# Patient Record
Sex: Female | Born: 1950 | Race: White | Hispanic: No | Marital: Married | Smoking: Never smoker
Health system: Southern US, Community
[De-identification: ages and names within clinical notes are randomized; demographics above are authoritative.]

## PROBLEM LIST (undated history)

## (undated) DIAGNOSIS — F32A Depression, unspecified: Secondary | ICD-10-CM

## (undated) DIAGNOSIS — K219 Gastro-esophageal reflux disease without esophagitis: Secondary | ICD-10-CM

## (undated) DIAGNOSIS — H409 Unspecified glaucoma: Secondary | ICD-10-CM

## (undated) DIAGNOSIS — H8109 Meniere's disease, unspecified ear: Secondary | ICD-10-CM

## (undated) DIAGNOSIS — Z87898 Personal history of other specified conditions: Secondary | ICD-10-CM

## (undated) DIAGNOSIS — I10 Essential (primary) hypertension: Secondary | ICD-10-CM

## (undated) DIAGNOSIS — M1990 Unspecified osteoarthritis, unspecified site: Secondary | ICD-10-CM

## (undated) DIAGNOSIS — F329 Major depressive disorder, single episode, unspecified: Secondary | ICD-10-CM

## (undated) DIAGNOSIS — E785 Hyperlipidemia, unspecified: Secondary | ICD-10-CM

## (undated) DIAGNOSIS — G4730 Sleep apnea, unspecified: Secondary | ICD-10-CM

## (undated) HISTORY — PX: CHOLECYSTECTOMY: SHX55

## (undated) HISTORY — DX: Major depressive disorder, single episode, unspecified: F32.9

## (undated) HISTORY — DX: Personal history of other specified conditions: Z87.898

## (undated) HISTORY — DX: Unspecified osteoarthritis, unspecified site: M19.90

## (undated) HISTORY — PX: APPENDECTOMY: SHX54

## (undated) HISTORY — DX: Depression, unspecified: F32.A

---

## 1993-05-03 HISTORY — PX: CRYOTHERAPY: SHX1416

## 1995-02-17 HISTORY — PX: ABDOMINAL HYSTERECTOMY: SHX81

## 2005-05-19 ENCOUNTER — Ambulatory Visit: Payer: Self-pay | Admitting: Otolaryngology

## 2006-02-15 ENCOUNTER — Emergency Department: Payer: Self-pay | Admitting: General Practice

## 2006-08-03 HISTORY — PX: CARDIAC CATHETERIZATION: SHX172

## 2006-10-06 ENCOUNTER — Ambulatory Visit: Payer: Self-pay | Admitting: Gastroenterology

## 2006-10-15 ENCOUNTER — Ambulatory Visit (HOSPITAL_COMMUNITY): Admission: RE | Admit: 2006-10-15 | Discharge: 2006-10-15 | Payer: Self-pay | Admitting: Cardiovascular Disease

## 2006-10-15 ENCOUNTER — Emergency Department: Payer: Self-pay | Admitting: Emergency Medicine

## 2007-11-16 ENCOUNTER — Emergency Department: Payer: Self-pay | Admitting: Emergency Medicine

## 2007-11-16 ENCOUNTER — Other Ambulatory Visit: Payer: Self-pay

## 2007-11-19 ENCOUNTER — Ambulatory Visit: Payer: Self-pay | Admitting: Otolaryngology

## 2009-01-31 ENCOUNTER — Ambulatory Visit: Payer: Self-pay | Admitting: Internal Medicine

## 2009-02-01 ENCOUNTER — Emergency Department: Payer: Self-pay | Admitting: Emergency Medicine

## 2009-02-15 ENCOUNTER — Ambulatory Visit: Payer: Self-pay | Admitting: Internal Medicine

## 2009-02-21 ENCOUNTER — Ambulatory Visit: Payer: Self-pay | Admitting: Gastroenterology

## 2009-03-01 ENCOUNTER — Ambulatory Visit: Payer: Self-pay | Admitting: Gastroenterology

## 2009-03-22 ENCOUNTER — Ambulatory Visit: Payer: Self-pay | Admitting: Surgery

## 2009-07-30 ENCOUNTER — Ambulatory Visit: Payer: Self-pay

## 2009-07-31 ENCOUNTER — Ambulatory Visit: Payer: Self-pay

## 2009-08-03 DIAGNOSIS — Z87898 Personal history of other specified conditions: Secondary | ICD-10-CM

## 2009-08-03 HISTORY — DX: Personal history of other specified conditions: Z87.898

## 2009-08-07 ENCOUNTER — Ambulatory Visit: Payer: Self-pay

## 2010-01-14 ENCOUNTER — Ambulatory Visit: Payer: Self-pay | Admitting: Family Medicine

## 2010-08-13 ENCOUNTER — Ambulatory Visit: Payer: Self-pay

## 2011-02-26 ENCOUNTER — Telehealth: Payer: Self-pay | Admitting: Family Medicine

## 2011-02-26 NOTE — Telephone Encounter (Signed)
error 

## 2011-11-09 ENCOUNTER — Ambulatory Visit: Payer: Self-pay

## 2012-10-05 ENCOUNTER — Emergency Department: Payer: Self-pay | Admitting: Emergency Medicine

## 2012-10-05 LAB — URINALYSIS, COMPLETE
Bacteria: NONE SEEN
Bilirubin,UR: NEGATIVE
Glucose,UR: NEGATIVE mg/dL (ref 0–75)
Ketone: NEGATIVE
Nitrite: NEGATIVE
Ph: 7 (ref 4.5–8.0)
Protein: NEGATIVE
Specific Gravity: 1.009 (ref 1.003–1.030)

## 2012-10-05 LAB — COMPREHENSIVE METABOLIC PANEL
Albumin: 3.2 g/dL — ABNORMAL LOW (ref 3.4–5.0)
Anion Gap: 6 — ABNORMAL LOW (ref 7–16)
Bilirubin,Total: 0.6 mg/dL (ref 0.2–1.0)
Chloride: 107 mmol/L (ref 98–107)
Co2: 22 mmol/L (ref 21–32)
EGFR (African American): 46 — ABNORMAL LOW
Glucose: 105 mg/dL — ABNORMAL HIGH (ref 65–99)
Potassium: 5.2 mmol/L — ABNORMAL HIGH (ref 3.5–5.1)
SGOT(AST): 43 U/L — ABNORMAL HIGH (ref 15–37)
Sodium: 135 mmol/L — ABNORMAL LOW (ref 136–145)
Total Protein: 7.4 g/dL (ref 6.4–8.2)

## 2012-10-05 LAB — CLOSTRIDIUM DIFFICILE BY PCR

## 2012-10-05 LAB — CBC
HGB: 15.5 g/dL (ref 12.0–16.0)
MCH: 32.2 pg (ref 26.0–34.0)
Platelet: 276 10*3/uL (ref 150–440)
RBC: 4.8 10*6/uL (ref 3.80–5.20)
RDW: 12.9 % (ref 11.5–14.5)
WBC: 9.6 10*3/uL (ref 3.6–11.0)

## 2012-10-05 LAB — LIPASE, BLOOD: Lipase: 146 U/L (ref 73–393)

## 2012-10-05 LAB — OCCULT BLOOD X 1 CARD TO LAB, STOOL: Occult Blood, Feces: NEGATIVE

## 2012-10-06 LAB — COMPREHENSIVE METABOLIC PANEL
Albumin: 3.1 g/dL — ABNORMAL LOW (ref 3.4–5.0)
Alkaline Phosphatase: 100 U/L (ref 50–136)
Anion Gap: 6 — ABNORMAL LOW (ref 7–16)
BUN: 21 mg/dL — ABNORMAL HIGH (ref 7–18)
Bilirubin,Total: 0.8 mg/dL (ref 0.2–1.0)
Calcium, Total: 8.1 mg/dL — ABNORMAL LOW (ref 8.5–10.1)
Co2: 22 mmol/L (ref 21–32)
Creatinine: 1.81 mg/dL — ABNORMAL HIGH (ref 0.60–1.30)
EGFR (Non-African Amer.): 29 — ABNORMAL LOW
Osmolality: 271 (ref 275–301)
SGOT(AST): 65 U/L — ABNORMAL HIGH (ref 15–37)
Sodium: 134 mmol/L — ABNORMAL LOW (ref 136–145)
Total Protein: 7.3 g/dL (ref 6.4–8.2)

## 2012-10-06 LAB — CBC WITH DIFFERENTIAL/PLATELET
Basophil #: 0.2 10*3/uL — ABNORMAL HIGH (ref 0.0–0.1)
Eosinophil #: 0.1 10*3/uL (ref 0.0–0.7)
Lymphocyte %: 14.2 %
MCH: 33.5 pg (ref 26.0–34.0)
MCHC: 34.9 g/dL (ref 32.0–36.0)
MCV: 96 fL (ref 80–100)
Monocyte #: 0.8 x10 3/mm (ref 0.2–0.9)
Monocyte %: 10.2 %
Neutrophil #: 5.9 10*3/uL (ref 1.4–6.5)
Neutrophil %: 72.2 %
Platelet: 282 10*3/uL (ref 150–440)
RBC: 4.58 10*6/uL (ref 3.80–5.20)

## 2012-10-06 LAB — TROPONIN I: Troponin-I: <0.02 ng/mL

## 2012-10-06 LAB — LIPASE, BLOOD: Lipase: 133 U/L (ref 73–393)

## 2012-10-07 ENCOUNTER — Inpatient Hospital Stay: Payer: Self-pay | Admitting: Internal Medicine

## 2012-10-07 LAB — CLOSTRIDIUM DIFFICILE BY PCR

## 2012-10-07 LAB — COMPREHENSIVE METABOLIC PANEL
Albumin: 2.4 g/dL — ABNORMAL LOW (ref 3.4–5.0)
BUN: 17 mg/dL (ref 7–18)
Bilirubin,Total: 1.4 mg/dL — ABNORMAL HIGH (ref 0.2–1.0)
Creatinine: 1.35 mg/dL — ABNORMAL HIGH (ref 0.60–1.30)
EGFR (Non-African Amer.): 42 — ABNORMAL LOW
Glucose: 97 mg/dL (ref 65–99)
SGOT(AST): 223 U/L — ABNORMAL HIGH (ref 15–37)
Sodium: 138 mmol/L (ref 136–145)

## 2012-10-07 LAB — CBC WITH DIFFERENTIAL/PLATELET
Basophil #: 0 10*3/uL (ref 0.0–0.1)
Eosinophil %: 0.6 %
HCT: 36.4 % (ref 35.0–47.0)
Lymphocyte %: 16.7 %
MCH: 33 pg (ref 26.0–34.0)
MCV: 96 fL (ref 80–100)
Neutrophil %: 71.2 %
Platelet: 191 10*3/uL (ref 150–440)
RBC: 3.8 10*6/uL (ref 3.80–5.20)
RDW: 12.5 % (ref 11.5–14.5)
WBC: 5.1 10*3/uL (ref 3.6–11.0)

## 2012-10-07 LAB — OCCULT BLOOD X 1 CARD TO LAB, STOOL: Occult Blood, Feces: POSITIVE

## 2012-10-07 LAB — URINALYSIS, COMPLETE
Nitrite: NEGATIVE
Protein: NEGATIVE
Specific Gravity: 1.008 (ref 1.003–1.030)

## 2012-10-07 LAB — STOOL CULTURE

## 2012-10-08 LAB — WBCS, STOOL

## 2012-10-08 LAB — COMPREHENSIVE METABOLIC PANEL
Alkaline Phosphatase: 141 U/L — ABNORMAL HIGH (ref 50–136)
Anion Gap: 9 (ref 7–16)
BUN: 9 mg/dL (ref 7–18)
Chloride: 115 mmol/L — ABNORMAL HIGH (ref 98–107)
EGFR (Non-African Amer.): 47 — ABNORMAL LOW
Glucose: 96 mg/dL (ref 65–99)
SGOT(AST): 93 U/L — ABNORMAL HIGH (ref 15–37)
Sodium: 141 mmol/L (ref 136–145)

## 2012-10-08 LAB — CBC WITH DIFFERENTIAL/PLATELET
Basophil #: 0 10*3/uL (ref 0.0–0.1)
Basophil %: 0.4 %
Eosinophil #: 0.1 10*3/uL (ref 0.0–0.7)
Eosinophil %: 1.4 %
MCHC: 34.3 g/dL (ref 32.0–36.0)
Monocyte #: 0.4 x10 3/mm (ref 0.2–0.9)
Monocyte %: 8.1 %
Platelet: 204 10*3/uL (ref 150–440)
RBC: 3.79 10*6/uL — ABNORMAL LOW (ref 3.80–5.20)
WBC: 4.7 10*3/uL (ref 3.6–11.0)

## 2012-10-09 LAB — CBC WITH DIFFERENTIAL/PLATELET
Basophil #: 0.1 10*3/uL (ref 0.0–0.1)
Eosinophil #: 0.1 10*3/uL (ref 0.0–0.7)
Eosinophil %: 1.2 %
HCT: 34.2 % — ABNORMAL LOW (ref 35.0–47.0)
HGB: 11.9 g/dL — ABNORMAL LOW (ref 12.0–16.0)
Lymphocyte %: 24.1 %
MCH: 33.6 pg (ref 26.0–34.0)
MCHC: 34.8 g/dL (ref 32.0–36.0)
MCV: 97 fL (ref 80–100)
Monocyte #: 0.4 x10 3/mm (ref 0.2–0.9)
Monocyte %: 7.1 %
Neutrophil #: 3.7 10*3/uL (ref 1.4–6.5)
RBC: 3.54 10*6/uL — ABNORMAL LOW (ref 3.80–5.20)
WBC: 5.5 10*3/uL (ref 3.6–11.0)

## 2012-10-09 LAB — COMPREHENSIVE METABOLIC PANEL
Albumin: 2.4 g/dL — ABNORMAL LOW (ref 3.4–5.0)
Alkaline Phosphatase: 124 U/L (ref 50–136)
Anion Gap: 6 — ABNORMAL LOW (ref 7–16)
Bilirubin,Total: 0.5 mg/dL (ref 0.2–1.0)
Calcium, Total: 7.7 mg/dL — ABNORMAL LOW (ref 8.5–10.1)
Chloride: 113 mmol/L — ABNORMAL HIGH (ref 98–107)
Co2: 23 mmol/L (ref 21–32)
Creatinine: 1.2 mg/dL (ref 0.60–1.30)
EGFR (African American): 56 — ABNORMAL LOW
Osmolality: 282 (ref 275–301)
Sodium: 142 mmol/L (ref 136–145)

## 2012-10-09 LAB — STOOL CULTURE

## 2013-11-17 DIAGNOSIS — E8940 Asymptomatic postprocedural ovarian failure: Secondary | ICD-10-CM | POA: Insufficient documentation

## 2013-12-06 ENCOUNTER — Ambulatory Visit: Payer: Self-pay | Admitting: Internal Medicine

## 2014-01-12 ENCOUNTER — Ambulatory Visit: Payer: Self-pay | Admitting: Internal Medicine

## 2014-01-25 DIAGNOSIS — M5416 Radiculopathy, lumbar region: Secondary | ICD-10-CM | POA: Insufficient documentation

## 2014-03-22 DIAGNOSIS — M1612 Unilateral primary osteoarthritis, left hip: Secondary | ICD-10-CM | POA: Insufficient documentation

## 2014-03-26 ENCOUNTER — Ambulatory Visit: Payer: Self-pay

## 2014-11-23 NOTE — Discharge Summary (Signed)
PATIENT NAME:  Tiffany, Cisneros MR#:  [id number] DATE OF BIRTH:  18-Jul-1951  DATE OF ADMISSION:  10/07/2012 DATE OF DISCHARGE:  10/09/2012  DISCHARGE DIAGNOSES:  1.  Viral hepatitis/gastroenteritis.  2.  Reflux esophagitis.  3.  Urinary incontinence.   DISCHARGE MEDICATIONS: Sertraline 50 mg daily, Prilosec 20 mg daily, estradiol weekly, Toviaz 8 mg q.p.m., , hyoscyamine 0.5 mg b.i.d. x 3 days.   REASON FOR ADMISSION: This 64 year old female presents with intractable diarrhea and diffuse abdominal pain with abnormal LFTs. Please see history and physical for HPI, past medical history and physical exam.  HOSPITAL COURSE: The patient was admitted. C. diff and stool cultures were negative. Hepatitis A IgM negative. IgG positive. The patient was hydrated and placed on IV Protonix. It was thought that this was likely viral gastroenteritis with viral hepatitis, likely adenovirus. She was given Levsin and Imodium. Her symptoms improved. She is back to baseline. Her LFTs normalized prior to discharge. She was still be on an anti-spasm medicines for 3 days with Probiotic, back to work tomorrow.    ____________________________ Rusty Aus, MD mfm:aw D: 10/09/2012 09:52:53 ET T: 10/09/2012 11:57:58 ET JOB#: [id number]  cc: Rusty Aus, MD, <Dictator> Rusty Aus MD ELECTRONICALLY SIGNED 10/12/2012 7:58

## 2014-11-23 NOTE — H&P (Signed)
PATIENT NAME:  Tiffany Cisneros, Tiffany Cisneros MR#:  [id number] DATE OF BIRTH:  03-03-1951  DATE OF ADMISSION:  10/07/2012  PRIMARY CARE PHYSICIAN:  Dr. Emily Filbert.   REFERRING PHYSICIAN:  Dr. Owens Shark.   CHIEF COMPLAINT:  Diarrhea.   HISTORY OF PRESENT ILLNESS:  The patient is a 64 year old Caucasian female with a past medical history of hypertension and hyperlipidemia, is presenting to the ER with a chief complaint of a 5 day history of watery diarrhea.  The patient has been experiencing watery diarrhea with no blood, nausea and lower abdominal pain for the past 5 days.  She was evaluated in the ER on March 5th for the same etiology and she was given IV fluids and sent home with nausea medication.  Her clinical situation did not get better and it has been getting worse.  The patient was brought into the ER again.  During the previous ER visit she had CAT scan of the abdomen and pelvis done which has revealed no acute pathology.  This time patient had an abdominal series done which has revealed distended bowel, but no air-fluid levels.  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  The patient denies any fever, but complaining of weakness and dizziness.  Family members are at bedside.  Abdominal pain is in the lower part of the abdomen, squeezing type, 8 to 9 out of 10.   PAST MEDICAL HISTORY:  Hypertension and hyperlipidemia.   PAST SURGICAL HISTORY:  Appendectomy, hysterectomy, cholecystectomy.   ALLERGIES:  She is allergic to [place].   HOME MEDICATIONS:  Zofran ODT 1 tablet by mouth q. 8 hours, Toviaz 1 tablet by mouth once daily, sertraline 1 tablet once daily, Prevacid 20 mg once daily, Augmentin 1 tablet by mouth q. 12 hours, Align capsule 4 mg once daily as needed.   PSYCHOSOCIAL HISTORY:  Lives at home with husband.  Denies smoking, alcohol or illicit drug usage.   FAMILY HISTORY:  Her father had a  history of TIA.   REVIEW OF SYSTEMS:  CONSTITUTIONAL:  Denies any fever, but complaining of fatigue and weakness.  Complaining of her abdominal pain.  Denies any weight gain.  EYES:  No blurry vision or glaucoma.  EARS, NOSE, THROAT:  Denies any ear pain or snoring, postnasal drip.  RESPIRATIONS:  No cough, COPD.  CARDIOVASCULAR:  No chest pain, palpitations, syncope.  GASTROINTESTINAL:  Complaining of nausea.  Denies any vomiting.  Complaining of diarrhea for the past five days.  No hematemesis or melena.  Lower abdominal pain is present.  GENITOURINARY:  No dysuria, hematuria. GYNECOLOGIC AND BREASTS:  No breast mass or vaginal discharge.  ENDOCRINE:  No polyuria, polyphagia, polydipsia.  Denies any thyroid problems.  HEMATOLOGIC AND LYMPHATIC:  Denies any anemia or easy bruising.  INTEGUMENTARY:  No acne, rash, lesions.  MUSCULOSKELETAL:  No joint pain in the neck, back, shoulder.  Denies arthritis or gout.  NEUROLOGIC:  No vertigo.  No ataxia.  Denies dementia, CVA, TIA.  PSYCHIATRIC:  No history of ADD or OCD.   PHYSICAL EXAMINATION: VITAL SIGNS:  Temperature 98.2, pulse 61, respirations 16, blood pressure 123/53, pulse ox 96%.  GENERAL APPEARANCE:  Uncomfortable from pain, but not under acute distress, moderately built and moderately nourished.  HEENT:  Normocephalic, atraumatic.  Pupils are equally reacting to light and accommodation.  No scleral icterus.  No conjunctival injection.  NECK:  Supple.  No JVD.  No thyromegaly.  No lymphadenopathy.  LUNGS:  Clear to auscultation bilaterally.  No accessory muscle usage.  No anterior chest wall tenderness on palpation.  CARDIAC:  S1, S2 normal.  Regular rate and rhythm.  No murmurs.  No peripheral edema.  GASTROINTESTINAL:  Soft.  Hyperactive bowel sounds in all four quadrants.  Diffuse lower abdominal tenderness is present.  No epigastric tenderness.  No right upper quadrant or left upper quadrant tenderness is present.  No rebound  tenderness.  No CV angle tenderness. NEUROLOGIC:  Awake, alert and oriented x 3.  Answering questions appropriately.  Cranial nerves II through XII are grossly intact.  Motor and sensory is grossly intact.  EXTREMITIES:  No edema.  No cyanosis.  No clubbing.  SKIN:  With no rashes, lesions or acne.  Dry to touch.  Normal skin turgor.  MUSCULOSKELETAL:  No joint effusion, tenderness or erythema.  PSYCHIATRIC:  Normal mood and affect.   LABORATORY AND IMAGING STUDIES:  Abdominal series has revealed distended bowels, but no air fluid levels.  Chest x-ray, no acute findings.  A 12-lead EKG, normal sinus rhythm.  No acute ST-T wave changes.  The patient's glucose is 86, BUN 21, creatinine 1.81, sodium 134, potassium 3.8, chloride 106, CO2 22, GFR is 29, anion gap 6, serum osmolality 271, calcium 8.1, lipase is 133, troponin T less than 0.02.  WBC 8.2, hemoglobin 15.3, hematocrit is 43.9, platelet count 282.  Stool for Clostridium difficile toxin, which was collected on March 5th, is negative.  Stool culture collected from March 5th has revealed no Salmonella, Shigella, Escherichia coli or Campylobacter.  Clostridium difficile toxin is also negative.  LFTs, albumin is low at 3.1, alkaline phosphatase is at 100, ALT 204, AST is 65.   ASSESSMENT AND PLAN:  A 64 year old female presenting with lower abdominal pain and diarrhea associated with nausea, will be admitted with the following assessment and plan.  1.  Acute gastroenteritis, probably viral.  We will keep her nothing by mouth.  We will provide her IV fluids.  Pain management with morphine IV.  We will provide her Protonix IV for GI prophylaxis.  We will repeat stool tests including Clostridium difficile toxin.  We will place her on enteric precautions.  If the situation is getting worse we will consider adding antibiotics.  Recent stool test which was done on March 5th has revealed negative Clostridium difficile, Shigella, Salmonella, Escherichia coli and  Campylobacter.  2.  Acute kidney injury.  We will give her IV fluids.  3.  Hypertension.  Blood pressure is on the lower side.  We will provide her IV fluids and hold home medications for blood pressure.  4.  Hyperlipidemia.  Hold her home medications.  5.  We will provide her deep vein thrombosis prophylaxis.   CODE STATUS:  SHE IS FULL CODE.   TOTAL TIME SPENT ON ADMISSION:  Is 50 minutes.   The diagnosis and plan of care was discussed in detail with the patient and her family members at bedside.  They all verbalized understanding of the plan and patient will be turned over to Dr. Emily Filbert in a.m.     ____________________________ Nicholes Mango, MD ag:ea D: 10/07/2012 00:45:35 ET T: 10/07/2012 03:02:41 ET JOB#: [id number]  cc: Nicholes Mango, MD, <Dictator> Nicholes Mango MD ELECTRONICALLY SIGNED 10/18/2012 0:58

## 2015-04-24 ENCOUNTER — Other Ambulatory Visit: Payer: Self-pay | Admitting: Certified Nurse Midwife

## 2015-04-24 DIAGNOSIS — Z13820 Encounter for screening for osteoporosis: Secondary | ICD-10-CM

## 2015-05-01 ENCOUNTER — Ambulatory Visit
Admission: RE | Admit: 2015-05-01 | Discharge: 2015-05-01 | Disposition: A | Discharge status: Home / Self Care | Payer: Medicare Other | Source: Ambulatory Visit | Attending: Certified Nurse Midwife | Admitting: Certified Nurse Midwife

## 2015-05-01 DIAGNOSIS — Z13820 Encounter for screening for osteoporosis: Secondary | ICD-10-CM | POA: Insufficient documentation

## 2015-05-01 DIAGNOSIS — M85862 Other specified disorders of bone density and structure, left lower leg: Secondary | ICD-10-CM | POA: Diagnosis not present

## 2016-06-15 ENCOUNTER — Other Ambulatory Visit: Payer: Self-pay | Admitting: Certified Nurse Midwife

## 2016-06-15 DIAGNOSIS — Z1231 Encounter for screening mammogram for malignant neoplasm of breast: Secondary | ICD-10-CM

## 2016-06-30 ENCOUNTER — Other Ambulatory Visit (HOSPITAL_COMMUNITY): Payer: Self-pay | Admitting: Gastroenterology

## 2016-06-30 DIAGNOSIS — R1310 Dysphagia, unspecified: Secondary | ICD-10-CM

## 2016-07-02 ENCOUNTER — Other Ambulatory Visit
Admission: RE | Admit: 2016-07-02 | Discharge: 2016-07-02 | Disposition: A | Discharge status: Home / Self Care | Payer: Medicare Other | Source: Ambulatory Visit | Attending: Gastroenterology | Admitting: Gastroenterology

## 2016-07-02 DIAGNOSIS — R1084 Generalized abdominal pain: Secondary | ICD-10-CM | POA: Diagnosis present

## 2016-07-02 DIAGNOSIS — R197 Diarrhea, unspecified: Secondary | ICD-10-CM | POA: Diagnosis present

## 2016-07-02 LAB — C DIFFICILE QUICK SCREEN W PCR REFLEX
C DIFFICILE (CDIFF) TOXIN: NEGATIVE
C DIFFICLE (CDIFF) ANTIGEN: NEGATIVE
C Diff interpretation: NOT DETECTED

## 2016-07-02 LAB — GASTROINTESTINAL PANEL BY PCR, STOOL (REPLACES STOOL CULTURE)
ADENOVIRUS F40/41: NOT DETECTED
ASTROVIRUS: NOT DETECTED
Campylobacter species: NOT DETECTED
Cryptosporidium: NOT DETECTED
Cyclospora cayetanensis: NOT DETECTED
ENTEROAGGREGATIVE E COLI (EAEC): NOT DETECTED
ENTEROPATHOGENIC E COLI (EPEC): NOT DETECTED
ENTEROTOXIGENIC E COLI (ETEC): NOT DETECTED
Entamoeba histolytica: NOT DETECTED
GIARDIA LAMBLIA: NOT DETECTED
NOROVIRUS GI/GII: NOT DETECTED
Plesimonas shigelloides: NOT DETECTED
ROTAVIRUS A: NOT DETECTED
SALMONELLA SPECIES: NOT DETECTED
SHIGELLA/ENTEROINVASIVE E COLI (EIEC): NOT DETECTED
Sapovirus (I, II, IV, and V): NOT DETECTED
Shiga like toxin producing E coli (STEC): NOT DETECTED
Vibrio cholerae: NOT DETECTED
Vibrio species: NOT DETECTED
Yersinia enterocolitica: NOT DETECTED

## 2016-07-04 LAB — MISC LABCORP TEST (SEND OUT): Labcorp test code: 108764

## 2016-07-08 ENCOUNTER — Other Ambulatory Visit: Payer: Self-pay | Admitting: Gastroenterology

## 2016-07-08 ENCOUNTER — Ambulatory Visit
Admission: RE | Admit: 2016-07-08 | Discharge: 2016-07-08 | Disposition: A | Discharge status: Home / Self Care | Payer: Medicare Other | Source: Ambulatory Visit | Attending: Gastroenterology | Admitting: Gastroenterology

## 2016-07-08 ENCOUNTER — Ambulatory Visit (HOSPITAL_COMMUNITY): Payer: Medicare Other

## 2016-07-08 DIAGNOSIS — R1310 Dysphagia, unspecified: Secondary | ICD-10-CM

## 2016-07-08 DIAGNOSIS — K449 Diaphragmatic hernia without obstruction or gangrene: Secondary | ICD-10-CM | POA: Insufficient documentation

## 2016-07-08 DIAGNOSIS — K219 Gastro-esophageal reflux disease without esophagitis: Secondary | ICD-10-CM | POA: Diagnosis present

## 2016-07-21 ENCOUNTER — Ambulatory Visit
Admission: RE | Admit: 2016-07-21 | Discharge: 2016-07-21 | Disposition: A | Discharge status: Home / Self Care | Payer: Medicare Other | Source: Ambulatory Visit | Attending: Certified Nurse Midwife | Admitting: Certified Nurse Midwife

## 2016-07-21 DIAGNOSIS — Z1231 Encounter for screening mammogram for malignant neoplasm of breast: Secondary | ICD-10-CM | POA: Insufficient documentation

## 2016-10-22 ENCOUNTER — Encounter: Payer: Self-pay | Admitting: *Deleted

## 2016-10-23 ENCOUNTER — Encounter
Admission: RE | Disposition: A | Discharge status: Home / Self Care | Payer: Self-pay | Source: Ambulatory Visit | Attending: Gastroenterology

## 2016-10-23 ENCOUNTER — Ambulatory Visit
Admission: RE | Admit: 2016-10-23 | Discharge: 2016-10-23 | Disposition: A | Discharge status: Home / Self Care | Payer: Medicare Other | Source: Ambulatory Visit | Attending: Gastroenterology | Admitting: Gastroenterology

## 2016-10-23 ENCOUNTER — Ambulatory Visit: Payer: Medicare Other | Admitting: *Deleted

## 2016-10-23 DIAGNOSIS — Z79890 Hormone replacement therapy (postmenopausal): Secondary | ICD-10-CM | POA: Diagnosis not present

## 2016-10-23 DIAGNOSIS — E785 Hyperlipidemia, unspecified: Secondary | ICD-10-CM | POA: Diagnosis not present

## 2016-10-23 DIAGNOSIS — K2950 Unspecified chronic gastritis without bleeding: Secondary | ICD-10-CM | POA: Insufficient documentation

## 2016-10-23 DIAGNOSIS — K648 Other hemorrhoids: Secondary | ICD-10-CM | POA: Insufficient documentation

## 2016-10-23 DIAGNOSIS — K529 Noninfective gastroenteritis and colitis, unspecified: Secondary | ICD-10-CM | POA: Insufficient documentation

## 2016-10-23 DIAGNOSIS — K210 Gastro-esophageal reflux disease with esophagitis: Secondary | ICD-10-CM | POA: Insufficient documentation

## 2016-10-23 DIAGNOSIS — K219 Gastro-esophageal reflux disease without esophagitis: Secondary | ICD-10-CM | POA: Diagnosis not present

## 2016-10-23 DIAGNOSIS — Q438 Other specified congenital malformations of intestine: Secondary | ICD-10-CM | POA: Diagnosis not present

## 2016-10-23 DIAGNOSIS — R1310 Dysphagia, unspecified: Secondary | ICD-10-CM | POA: Insufficient documentation

## 2016-10-23 DIAGNOSIS — K449 Diaphragmatic hernia without obstruction or gangrene: Secondary | ICD-10-CM | POA: Diagnosis not present

## 2016-10-23 DIAGNOSIS — I10 Essential (primary) hypertension: Secondary | ICD-10-CM | POA: Insufficient documentation

## 2016-10-23 DIAGNOSIS — G4730 Sleep apnea, unspecified: Secondary | ICD-10-CM | POA: Insufficient documentation

## 2016-10-23 DIAGNOSIS — K2210 Ulcer of esophagus without bleeding: Secondary | ICD-10-CM | POA: Insufficient documentation

## 2016-10-23 DIAGNOSIS — K640 First degree hemorrhoids: Secondary | ICD-10-CM | POA: Diagnosis not present

## 2016-10-23 DIAGNOSIS — Z79899 Other long term (current) drug therapy: Secondary | ICD-10-CM | POA: Diagnosis not present

## 2016-10-23 HISTORY — DX: Hyperlipidemia, unspecified: E78.5

## 2016-10-23 HISTORY — DX: Gastro-esophageal reflux disease without esophagitis: K21.9

## 2016-10-23 HISTORY — PX: COLONOSCOPY WITH PROPOFOL: SHX5780

## 2016-10-23 HISTORY — DX: Meniere's disease, unspecified ear: H81.09

## 2016-10-23 HISTORY — DX: Unspecified glaucoma: H40.9

## 2016-10-23 HISTORY — DX: Essential (primary) hypertension: I10

## 2016-10-23 HISTORY — DX: Sleep apnea, unspecified: G47.30

## 2016-10-23 HISTORY — PX: ESOPHAGOGASTRODUODENOSCOPY (EGD) WITH PROPOFOL: SHX5813

## 2016-10-23 SURGERY — COLONOSCOPY WITH PROPOFOL
Anesthesia: General

## 2016-10-23 MED ORDER — LIDOCAINE HCL (PF) 1 % IJ SOLN
INTRAMUSCULAR | Status: AC
  Administered 2016-10-23: 0.3 mg
  Filled 2016-10-23: qty 2

## 2016-10-23 MED ORDER — BUTAMBEN-TETRACAINE-BENZOCAINE 2-2-14 % EX AERO
INHALATION_SPRAY | CUTANEOUS | Status: AC
  Filled 2016-10-23: qty 20

## 2016-10-23 MED ORDER — PROPOFOL 500 MG/50ML IV EMUL
INTRAVENOUS | Status: AC
  Filled 2016-10-23: qty 50

## 2016-10-23 MED ORDER — SODIUM CHLORIDE 0.9 % IV SOLN: INTRAVENOUS | Status: DC

## 2016-10-23 MED ORDER — PROPOFOL 500 MG/50ML IV EMUL
INTRAVENOUS | Status: DC | PRN
  Administered 2016-10-23: 200 ug/kg/min via INTRAVENOUS
  Administered 2016-10-23: 100 ug/kg/min via INTRAVENOUS

## 2016-10-23 MED ORDER — LIDOCAINE HCL (PF) 1 % IJ SOLN: 2.0000 mL | Freq: Once | INTRAMUSCULAR | Status: DC

## 2016-10-23 MED ORDER — EPHEDRINE SULFATE 50 MG/ML IJ SOLN
INTRAMUSCULAR | Status: DC | PRN
  Administered 2016-10-23: 15 mg via INTRAVENOUS

## 2016-10-23 MED ORDER — SODIUM CHLORIDE 0.9 % IV SOLN
INTRAVENOUS | Status: DC
  Administered 2016-10-23: 1000 mL via INTRAVENOUS

## 2016-10-23 MED ORDER — PROPOFOL 10 MG/ML IV BOLUS
INTRAVENOUS | Status: AC
  Filled 2016-10-23: qty 20

## 2016-10-23 MED ORDER — EPHEDRINE SULFATE 50 MG/ML IJ SOLN
INTRAMUSCULAR | Status: AC
  Filled 2016-10-23: qty 1

## 2016-10-23 NOTE — H&P (Signed)
Outpatient short stay form Pre-procedure 10/23/2016 1:19 PM Tiffany Sails MD  Primary Physician: Dr. Emily Filbert  Reason for visit: EGD and colonoscopy  Reason for visit/history of present illness:  Patient is a 66 year old female presenting today as above. She has a long history of chronic reflux and had been taking Prilosec previously. She was just recently changed to Toprol result which has been of some benefit. She have some atypical cervical sensation with swallowing. She does not regurgitate foods. She does have both a bitter and burning reflux. sHe did have her gallbladder out several years ago. She tolerated her prep well. She takes no aspirin or blood thinning agents. Her last colonoscopy was about 9 years ago.  She did have a barium swallow with a tablet done on 07/08/2016. This impression showed a "normal appearances of the cervical esophagus with no laryngeal penetration of barium. Mildly prominent cricopharyngeus muscle impression may reflect chronic reflux. A small amount of spontaneous gas after reflux was demonstrated fluoroscopically. Small incompletely reducible hiatal hernia with transient narrowing at the GE junction not obstructed to the liquid barium or to the barium tablet. Direct visualization of the esophagus may be useful. On further discussion with the patient she does tend to eat very quickly she does not alternate liquids and solids.    Current Facility-Administered Medications:  .  0.9 %  sodium chloride infusion, , Intravenous, Continuous, Tiffany Sails, MD, Last Rate: 20 mL/hr at 10/23/16 1304, 1,000 mL at 10/23/16 1304 .  0.9 %  sodium chloride infusion, , Intravenous, Continuous, Tiffany Sails, MD .  butamben-tetracaine-benzocaine (CETACAINE) 09-04-12 % spray, , , ,  .  lidocaine (PF) (XYLOCAINE) 1 % injection 2 mL, 2 mL, Intradermal, Once, Tiffany Sails, MD  Prescriptions Prior to Admission  Medication Sig Dispense Refill Last Dose  .  acetaminophen (TYLENOL) 500 MG tablet Take 1,000 mg by mouth every 6 (six) hours as needed.     Marland Kitchen amLODipine-benazepril (LOTREL) 5-10 MG capsule Take 1 capsule by mouth daily.     . colestipol (COLESTID) 1 g tablet Take 1 g by mouth 2 (two) times daily.     Marland Kitchen estradiol (CLIMARA - DOSED IN MG/24 HR) 0.05 mg/24hr patch Place 0.05 mg onto the skin once a week.     . hydrochlorothiazide (HYDRODIURIL) 25 MG tablet Take 25 mg by mouth daily as needed.     . meclizine (ANTIVERT) 25 MG tablet Take 25 mg by mouth 3 (three) times daily as needed for dizziness.     . pantoprazole (PROTONIX) 40 MG tablet Take 40 mg by mouth daily.     . sertraline (ZOLOFT) 50 MG tablet Take 50 mg by mouth daily.        Allergies  Allergen Reactions  . Codeine   . Statins      Past Medical History:  Diagnosis Date  . GERD (gastroesophageal reflux disease)   . Glaucoma   . Hyperlipidemia   . Hypertension   . Meniere disease   . Sleep apnea    on CPAP    Review of systems:      Physical Exam    Heart and lungs: Regular rate and rhythm without rub or gallop, lungs are bilaterally clear.    HEENT: Normocephalic atraumatic eyes are anicteric    Other:     Pertinant exam for procedure: Soft nontender nondistended bowel sounds positive normoactive.    Planned proceedures: EGD, colonoscopy and indicated procedures. I have discussed the risks benefits  and complications of procedures to include not limited to bleeding, infection, perforation and the risk of sedation and the patient wishes to proceed.    Tiffany Sails, MD Gastroenterology 10/23/2016  1:19 PM

## 2016-10-23 NOTE — Op Note (Signed)
[facility] Gastroenterology Patient Name: Tiffany Cisneros Procedure Date: 10/23/2016 12:51 PM MRN: [id number] Account #: [id number] Date of Birth: 24-Mar-1951 Admit Type: Outpatient Age: 67 Room: [facility] ENDO ROOM 3 Gender: Female Note Status: Finalized Procedure:            Colonoscopy Indications:          Chronic diarrhea Providers:            Lollie Sails, MD Medicines:            Monitored Anesthesia Care Complications:        No immediate complications. Procedure:            Pre-Anesthesia Assessment:                       - ASA Grade Assessment: II - A patient with mild                        systemic disease.                       After obtaining informed consent, the colonoscope was                        passed under direct vision. Throughout the procedure,                        the patient's blood pressure, pulse, and oxygen                        saturations were monitored continuously. The                        Colonoscope was introduced through the anus and                        advanced to the the cecum, identified by appendiceal                        orifice and ileocecal valve. The colonoscopy was                        unusually difficult due to poor bowel prep and a                        tortuous colon. Successful completion of the procedure                        was aided by changing the patient to a supine position,                        changing the patient to a prone position, using manual                        pressure and lavage. The quality of the bowel                        preparation was fair. Findings:      The sigmoid colon was significantly redundant.      Non-bleeding internal hemorrhoids were found during retroflexion. The       hemorrhoids  were small and Grade I (internal hemorrhoids that do not       prolapse).      The digital rectal exam was normal.      Biopsies for histology were taken with a cold forceps from  the right       colon and left colon for evaluation of microscopic colitis. Impression:           - Preparation of the colon was fair.                       - Redundant colon.                       - Non-bleeding internal hemorrhoids.                       - Biopsies were taken with a cold forceps from the                        right colon and left colon for evaluation of                        microscopic colitis. Recommendation:       - Discharge patient to home.                       - Await pathology results.                       - Telephone GI clinic for pathology results in 1 week.                       - Return to GI clinic in 1 month. Procedure Code(s):    --- Professional ---                       [id number], Colonoscopy, flexible; with biopsy, single or                        multiple Diagnosis Code(s):    --- Professional ---                       K64.0, First degree hemorrhoids                       K52.9, Noninfective gastroenteritis and colitis,                        unspecified                       Q43.8, Other specified congenital malformations of                        intestine CPT copyright 2016 American Medical Association. All rights reserved. The codes documented in this report are preliminary and upon coder review may  be revised to meet current compliance requirements. Lollie Sails, MD 10/23/2016 2:45:05 PM This report has been signed electronically. Number of Addenda: 0 Note Initiated On: 10/23/2016 12:51 PM Scope Withdrawal Time: 0 hours 5 minutes 37 seconds  Total Procedure Duration: 0 hours 24 minutes 10 seconds       [facility]

## 2016-10-23 NOTE — Anesthesia Preprocedure Evaluation (Signed)
Anesthesia Evaluation  Patient identified by MRN, date of birth, ID band Patient awake    Reviewed: Allergy & Precautions, NPO status , Patient's Chart, lab work & pertinent test results  History of Anesthesia Complications (+) PONV and history of anesthetic complications  Airway Mallampati: II       Dental   Pulmonary sleep apnea and Continuous Positive Airway Pressure Ventilation ,           Cardiovascular hypertension, Pt. on medications      Neuro/Psych negative neurological ROS     GI/Hepatic Neg liver ROS, GERD  Medicated and Poorly Controlled,  Endo/Other  negative endocrine ROS  Renal/GU negative Renal ROS     Musculoskeletal   Abdominal   Peds  Hematology negative hematology ROS (+)   Anesthesia Other Findings   Reproductive/Obstetrics                             Anesthesia Physical Anesthesia Plan  ASA: II  Anesthesia Plan: General   Post-op Pain Management:    Induction: Intravenous  Airway Management Planned: Nasal Cannula  Additional Equipment:   Intra-op Plan:   Post-operative Plan:   Informed Consent: I have reviewed the patients History and Physical, chart, labs and discussed the procedure including the risks, benefits and alternatives for the proposed anesthesia with the patient or authorized representative who has indicated his/her understanding and acceptance.     Plan Discussed with:   Anesthesia Plan Comments:         Anesthesia Quick Evaluation

## 2016-10-23 NOTE — Transfer of Care (Signed)
Immediate Anesthesia Transfer of Care Note  Patient: Tiffany Cisneros  Procedure(s) Performed: Procedure(s): COLONOSCOPY WITH PROPOFOL (N/A) ESOPHAGOGASTRODUODENOSCOPY (EGD) WITH PROPOFOL (N/A)  Patient Location: PACU  Anesthesia Type:General  Level of Consciousness: awake, alert  and oriented  Airway & Oxygen Therapy: Patient Spontanous Breathing and Patient connected to nasal cannula oxygen  Post-op Assessment: Report given to RN and Post -op Vital signs reviewed and stable  Post vital signs: Reviewed and stable  Last Vitals:  Vitals:   10/23/16 1232 10/23/16 1447  BP: (!) 167/73 (!) 109/57  Pulse: 62 62  Resp: 17 17  Temp: 36.3 C 36.1 C    Last Pain:  Vitals:   10/23/16 1447  TempSrc: Tympanic  PainSc: Asleep         Complications: No apparent anesthesia complications

## 2016-10-23 NOTE — Anesthesia Postprocedure Evaluation (Signed)
Anesthesia Post Note  Patient: Tiffany Cisneros  Procedure(s) Performed: Procedure(s) (LRB): COLONOSCOPY WITH PROPOFOL (N/A) ESOPHAGOGASTRODUODENOSCOPY (EGD) WITH PROPOFOL (N/A)  Patient location during evaluation: Endoscopy Anesthesia Type: General Level of consciousness: awake and alert Pain management: pain level controlled Vital Signs Assessment: post-procedure vital signs reviewed and stable Respiratory status: spontaneous breathing and respiratory function stable Cardiovascular status: stable Anesthetic complications: no     Last Vitals:  Vitals:   10/23/16 1232 10/23/16 1447  BP: (!) 167/73 (!) 109/57  Pulse: 62 62  Resp: 17 17  Temp: 36.3 C 36.1 C    Last Pain:  Vitals:   10/23/16 1447  TempSrc: Tympanic  PainSc: Asleep                 Bradley Handyside K

## 2016-10-23 NOTE — Anesthesia Post-op Follow-up Note (Cosign Needed)
Anesthesia QCDR form completed.        

## 2016-10-23 NOTE — Op Note (Signed)
[facility] Gastroenterology Patient Name: Tiffany Cisneros Procedure Date: 10/23/2016 12:51 PM MRN: [id number] Account #: [id number] Date of Birth: Apr 15, 1951 Admit Type: Outpatient Age: 66 Room: [facility] ENDO ROOM 3 Gender: Female Note Status: Finalized Procedure:            Upper GI endoscopy Indications:          Dysphagia, Gastro-esophageal reflux disease Providers:            Lollie Sails, MD Referring MD:         Rusty Aus, MD (Referring MD) Medicines:            Monitored Anesthesia Care Complications:        No immediate complications. Procedure:            Pre-Anesthesia Assessment:                       - ASA Grade Assessment: II - A patient with mild                        systemic disease.                       After obtaining informed consent, the endoscope was                        passed under direct vision. Throughout the procedure,                        the patient's blood pressure, pulse, and oxygen                        saturations were monitored continuously. The Endoscope                        was introduced through the mouth, and advanced to the                        third part of duodenum. The upper GI endoscopy was                        accomplished without difficulty. The patient tolerated                        the procedure. Findings:      A small hiatal hernia was present.      LA Grade B (one or more mucosal breaks greater than 5 mm, not extending       between the tops of two mucosal folds) esophagitis with no bleeding was       found.      Abnormal motility was noted in the middle third of the esophagus and in       the lower third of the esophagus. The cricopharyngeus was normal. There       is spasticity of the esophageal body. The distal esophagus/lower       esophageal sphincter is spastic, but gives up passage to the endoscope.       Tertiary peristaltic waves are noted.      A TTS dilator was passed through the  scope. Dilation with a 12-13.5-15       mm balloon dilator was performed to 15 mm in the  lower third of the       esophagus. Biopsies were taken with a cold forceps at the       gastroesophageal junction for histology.      Patchy mild inflammation characterized by congestion (edema) and       erythema was found in the gastric body and in the gastric antrum.       Biopsies were taken with a cold forceps for histology.      The examined duodenum was normal. Biopsies were taken with a cold       forceps for histology. Impression:           - Small hiatal hernia.                       - LA Grade B erosive esophagitis.                       - Abnormal esophageal motility, suspicious for                        esophageal spasm.                       - Bile gastritis. Biopsied.                       - Normal examined duodenum. Biopsied. Recommendation:       - Use Protonix (pantoprazole) 40 mg PO BID daily.                       - Use sucralfate tablets 1 gram PO QID for 1 month. Procedure Code(s):    --- Professional ---                       [phone], Esophagogastroduodenoscopy, flexible, transoral;                        with transendoscopic balloon dilation of esophagus                        (less than 30 mm diameter)                       [id number], Esophagogastroduodenoscopy, flexible, transoral;                        with biopsy, single or multiple Diagnosis Code(s):    --- Professional ---                       K44.9, Diaphragmatic hernia without obstruction or                        gangrene                       K20.8, Other esophagitis                       K22.4, Dyskinesia of esophagus                       K29.60, Other gastritis without bleeding  R13.10, Dysphagia, unspecified                       K21.9, Gastro-esophageal reflux disease without                        esophagitis CPT copyright 2016 American Medical Association. All rights reserved. The codes  documented in this report are preliminary and upon coder review may  be revised to meet current compliance requirements. Lollie Sails, MD 10/23/2016 2:14:40 PM This report has been signed electronically. Number of Addenda: 0 Note Initiated On: 10/23/2016 12:51 PM      [facility]

## 2016-10-24 ENCOUNTER — Encounter: Payer: Self-pay | Admitting: Emergency Medicine

## 2016-10-24 ENCOUNTER — Emergency Department: Payer: Medicare Other

## 2016-10-24 ENCOUNTER — Emergency Department
Admission: EM | Admit: 2016-10-24 | Discharge: 2016-10-25 | Disposition: A | Discharge status: Home / Self Care | Payer: Medicare Other | Attending: Emergency Medicine | Admitting: Emergency Medicine

## 2016-10-24 DIAGNOSIS — Z79899 Other long term (current) drug therapy: Secondary | ICD-10-CM | POA: Insufficient documentation

## 2016-10-24 DIAGNOSIS — R0602 Shortness of breath: Secondary | ICD-10-CM | POA: Insufficient documentation

## 2016-10-24 DIAGNOSIS — I10 Essential (primary) hypertension: Secondary | ICD-10-CM | POA: Insufficient documentation

## 2016-10-24 DIAGNOSIS — R079 Chest pain, unspecified: Secondary | ICD-10-CM | POA: Diagnosis not present

## 2016-10-24 LAB — BASIC METABOLIC PANEL
ANION GAP: 8 (ref 5–15)
BUN: 20 mg/dL (ref 6–20)
CO2: 22 mmol/L (ref 22–32)
Calcium: 9.2 mg/dL (ref 8.9–10.3)
Chloride: 106 mmol/L (ref 101–111)
Creatinine, Ser: 1.29 mg/dL — ABNORMAL HIGH (ref 0.44–1.00)
GFR calc Af Amer: 49 mL/min — ABNORMAL LOW (ref >60)
GFR, EST NON AFRICAN AMERICAN: 42 mL/min — AB (ref >60)
Glucose, Bld: 117 mg/dL — ABNORMAL HIGH (ref 65–99)
POTASSIUM: 3.7 mmol/L (ref 3.5–5.1)
SODIUM: 136 mmol/L (ref 135–145)

## 2016-10-24 LAB — CBC
HEMATOCRIT: 39.8 % (ref 35.0–47.0)
HEMOGLOBIN: 14 g/dL (ref 12.0–16.0)
MCH: 33.5 pg (ref 26.0–34.0)
MCHC: 35.1 g/dL (ref 32.0–36.0)
MCV: 95.4 fL (ref 80.0–100.0)
Platelets: 285 10*3/uL (ref 150–440)
RBC: 4.17 MIL/uL (ref 3.80–5.20)
RDW: 12.8 % (ref 11.5–14.5)
WBC: 10.6 10*3/uL (ref 3.6–11.0)

## 2016-10-24 LAB — TROPONIN I: Troponin I: <0.03 ng/mL (ref <0.03)

## 2016-10-24 MED ORDER — ONDANSETRON HCL 4 MG/2ML IJ SOLN
4.0000 mg | Freq: Once | INTRAMUSCULAR | Status: AC
  Administered 2016-10-24: 4 mg via INTRAVENOUS

## 2016-10-24 MED ORDER — IOPAMIDOL (ISOVUE-370) INJECTION 76%
60.0000 mL | Freq: Once | INTRAVENOUS | Status: AC | PRN
  Administered 2016-10-24: 60 mL via INTRAVENOUS

## 2016-10-24 MED ORDER — ONDANSETRON HCL 4 MG/2ML IJ SOLN
INTRAMUSCULAR | Status: AC
  Filled 2016-10-24: qty 2

## 2016-10-24 MED ORDER — MORPHINE SULFATE (PF) 4 MG/ML IV SOLN
INTRAVENOUS | Status: AC
  Filled 2016-10-24: qty 1

## 2016-10-24 MED ORDER — MORPHINE SULFATE (PF) 4 MG/ML IV SOLN
4.0000 mg | Freq: Once | INTRAVENOUS | Status: AC
  Administered 2016-10-24: 4 mg via INTRAVENOUS

## 2016-10-24 NOTE — ED Notes (Signed)
Pt states she had an endoscopy yesterday. Pt states since endoscopy she has felt like she is having trouble coughing, swallowing and has had sharp mid sternal chest pain. Pt states pain now radiates into her mid back, down bilateral arms. Pt states she has felt shob. Pt is anxious. Skin slightly flushed, warm and dry. Pt denies diaphoresis, states has felt dizzy. Difficult to assess breath sounds, pain on deep inspiration per pt. No tracheal deviation noted. No drooling noted.

## 2016-10-24 NOTE — ED Triage Notes (Signed)
Patient with complaint of mid sternal chest pain and shortness of breath that started last night. Patient states that the pain has become worse today. Patient had an egd yesterday. EGD showed severe acid reflux and hiatal hernia.

## 2016-10-24 NOTE — ED Provider Notes (Signed)
[facility] Emergency Department Provider Note   ____________________________________________   First MD Initiated Contact with Patient 10/24/16 2335     (approximate)  I have reviewed the triage vital signs and the nursing notes.   HISTORY  Chief Complaint Chest Injury and Shortness of Breath    HPI Tiffany Cisneros is a 66 y.o. female who comes into the hospital today with some chest pain. The patient reports that it started last night. The patient states that it's the center of her chest. She had an endoscopy yesterday afternoon and the pain started little time after that. She reports that sharp pain in her chest that gets worse when she swallows or takes a deep breath. The patient denies any nausea or vomiting. She reports her pain currently is a 0 out of 10 in intensity but she did receive some morphine when she arrived. The patient has been short of breath but denies any sweats. She reports that it doesn't hurt when she moves and she's never had this before. The patient was hypoxic to 86 when she initially arrived. The patient did not take any medication for the pain she just decided to come in and get checked out.   Past Medical History:  Diagnosis Date  . GERD (gastroesophageal reflux disease)   . Glaucoma   . Hyperlipidemia   . Hypertension   . Meniere disease   . Sleep apnea    on CPAP    There are no active problems to display for this patient.   Past Surgical History:  Procedure Laterality Date  . ABDOMINAL HYSTERECTOMY    . APPENDECTOMY    . CHOLECYSTECTOMY      Prior to Admission medications   Medication Sig Start Date End Date Taking? Authorizing Provider  acetaminophen (TYLENOL) 500 MG tablet Take 1,000 mg by mouth every 6 (six) hours as needed for mild pain or headache.     Historical Provider, MD  amLODipine-benazepril (LOTREL) 5-10 MG capsule Take 1 capsule by mouth daily.    Historical Provider, MD  colestipol (COLESTID) 1 g  tablet Take 1 g by mouth 2 (two) times daily.    Historical Provider, MD  estradiol (CLIMARA - DOSED IN MG/24 HR) 0.05 mg/24hr patch Place 0.05 mg onto the skin once a week.    Historical Provider, MD  hydrochlorothiazide (HYDRODIURIL) 25 MG tablet Take 25 mg by mouth daily as needed (fluid).     Historical Provider, MD  meclizine (ANTIVERT) 25 MG tablet Take 25 mg by mouth 3 (three) times daily as needed for dizziness.    Historical Provider, MD  pantoprazole (PROTONIX) 40 MG tablet Take 40 mg by mouth daily.    Historical Provider, MD  sertraline (ZOLOFT) 50 MG tablet Take 50 mg by mouth daily.    Historical Provider, MD  sucralfate (CARAFATE) 1 g tablet Take 1 tablet (1 g total) by mouth 2 (two) times daily. 10/25/16   Loney Hering, MD    Allergies Codeine and Statins  Family History  Problem Relation Age of Onset  . Breast cancer Sister 35  . Hypertension Mother   . CAD Father   . Stroke Father   . CAD Brother     Social History Social History  Substance Use Topics  . Smoking status: Never Smoker  . Smokeless tobacco: Never Used  . Alcohol use No    Review of Systems Constitutional: No fever/chills Eyes: No visual changes. ENT: No sore throat. Cardiovascular: chest pain. Respiratory:  shortness of breath. Gastrointestinal: No abdominal pain.  No nausea, no vomiting.  No diarrhea.  No constipation. Genitourinary: Negative for dysuria. Musculoskeletal: Negative for back pain. Skin: Negative for rash. Neurological: Negative for headaches, focal weakness or numbness.  10-point ROS otherwise negative.  ____________________________________________   PHYSICAL EXAM:  VITAL SIGNS: ED Triage Vitals  Enc Vitals Group     BP 10/24/16 2244 (!) 188/97     Pulse Rate 10/24/16 2244 67     Resp 10/24/16 2244 18     Temp 10/24/16 2244 98 F (36.7 C)     Temp Source 10/24/16 2244 Oral     SpO2 10/24/16 2244 98 %     Weight 10/24/16 2245 184 lb (83.5 kg)     Height  10/24/16 2245 5\' 5"  (1.651 m)     Head Circumference --      Peak Flow --      Pain Score 10/24/16 2245 9     Pain Loc --      Pain Edu? --      Excl. in [place]? --     Constitutional: Alert and oriented. Well appearing and in moderate distress. Eyes: Conjunctivae are normal. PERRL. EOMI. Head: Atraumatic. Nose: No congestion/rhinnorhea. Mouth/Throat: Mucous membranes are moist.  Oropharynx non-erythematous. Cardiovascular: Normal rate, regular rhythm. Grossly normal heart sounds.  Good peripheral circulation. Respiratory: Normal respiratory effort.  No retractions. Lungs CTAB. Gastrointestinal: Soft and nontender. No distention. Positive bowel sounds Musculoskeletal: No lower extremity tenderness nor edema.   Neurologic:  Normal speech and language.  Skin:  Skin is warm, dry and intact. Psychiatric: Mood and affect are normal.   ____________________________________________   LABS (all labs ordered are listed, but only abnormal results are displayed)  Labs Reviewed  BASIC METABOLIC PANEL - Abnormal; Notable for the following:       Result Value   Glucose, Bld 117 (*)    Creatinine, Ser 1.29 (*)    GFR calc non Af Amer 42 (*)    GFR calc Af Amer 49 (*)    All other components within normal limits  CBC  TROPONIN I  TROPONIN I   ____________________________________________  EKG  ED ECG REPORT I, Loney Hering, the attending physician, personally viewed and interpreted this ECG.   Date: 10/24/2016  EKG Time: 2249  Rate: 65  Rhythm: normal sinus rhythm  Axis: normal  Intervals:none  ST&T Change: none  ____________________________________________  RADIOLOGY  CXR CT angio chest ____________________________________________   PROCEDURES  Procedure(s) performed: None  Procedures  Critical Care performed: No  ____________________________________________   INITIAL IMPRESSION / ASSESSMENT AND PLAN / ED COURSE  Pertinent labs & imaging results that were  available during my care of the patient were reviewed by me and considered in my medical decision making (see chart for details).  This is a 66 year old female who comes into the hospital today with some chest pain. The patient had an endoscopy yesterday and she reports that it was after the endoscopy that she did develop this pain. She states it hurts when she swallows but she also has pain in her chest when she tries to take a deep breath. My concern is for either perforation or PE. The patient was hypoxic when she arrived to the hospital which increases my suspicion for pulmonary embolus. I will send the patient for a CTA as her chest x-ray is unremarkable. The patient's blood work is also unremarkable. She received a dose of morphine which helped her pain.  The patient will be reassessed.  Clinical Course as of Oct 25 313  Sat Oct 24, 2016  2335 No acute cardiopulmonary process seen. If the patient's symptoms persist, a CT of the chest following ingestion of water-soluble contrast could be considered as deemed clinically appropriate.   DG Chest 2 View [AW]  Sun Oct 25, 2016  0108 1. No CT evidence for acute pulmonary embolism. 2. No other acute abnormality within the chest. No evidence for acute esophageal injury or perforation status post recent endoscopy. 3. Mild dependent atelectasis within both lungs. 4. Cardiomegaly   CT Angio Chest PE W and/or Wo Contrast [AW]  4818 The patient did receive a dose of GI cocktail as well and her pain is controlled at this time. As her repeat troponin is negative she'll be discharged home. I will give the patient a dose of Carafate and she'll be discharged.  [AW]    Clinical Course User Index [AW] Loney Hering, MD     ____________________________________________   FINAL CLINICAL IMPRESSION(S) / ED DIAGNOSES  Final diagnoses:  Chest pain, unspecified type      NEW MEDICATIONS STARTED DURING THIS VISIT:  New Prescriptions    SUCRALFATE (CARAFATE) 1 G TABLET    Take 1 tablet (1 g total) by mouth 2 (two) times daily.     Note:  This document was prepared using Dragon voice recognition software and may include unintentional dictation errors.    Loney Hering, MD 10/25/16 [phone]

## 2016-10-24 NOTE — ED Notes (Signed)
Patient transported to CT 

## 2016-10-25 DIAGNOSIS — R079 Chest pain, unspecified: Secondary | ICD-10-CM | POA: Diagnosis not present

## 2016-10-25 LAB — TROPONIN I

## 2016-10-25 MED ORDER — ONDANSETRON HCL 4 MG/2ML IJ SOLN
4.0000 mg | Freq: Once | INTRAMUSCULAR | Status: AC
  Administered 2016-10-25: 4 mg via INTRAVENOUS

## 2016-10-25 MED ORDER — SUCRALFATE 1 G PO TABS
1.0000 g | ORAL_TABLET | Freq: Once | ORAL | Status: AC
  Administered 2016-10-25: 1 g via ORAL
  Filled 2016-10-25: qty 1

## 2016-10-25 MED ORDER — GI COCKTAIL ~~LOC~~
30.0000 mL | Freq: Once | ORAL | Status: AC
  Administered 2016-10-25: 30 mL via ORAL
  Filled 2016-10-25: qty 30

## 2016-10-25 MED ORDER — ONDANSETRON HCL 4 MG/2ML IJ SOLN
INTRAMUSCULAR | Status: AC
  Administered 2016-10-25: 4 mg via INTRAVENOUS
  Filled 2016-10-25: qty 2

## 2016-10-25 MED ORDER — MORPHINE SULFATE (PF) 4 MG/ML IV SOLN
4.0000 mg | Freq: Once | INTRAVENOUS | Status: AC
  Administered 2016-10-25: 4 mg via INTRAVENOUS

## 2016-10-25 MED ORDER — MORPHINE SULFATE (PF) 4 MG/ML IV SOLN
INTRAVENOUS | Status: AC
  Administered 2016-10-25: 4 mg via INTRAVENOUS
  Filled 2016-10-25: qty 1

## 2016-10-25 MED ORDER — SUCRALFATE 1 G PO TABS: 1.0000 g | ORAL_TABLET | Freq: Two times a day (BID) | ORAL | 0 refills | Status: DC

## 2016-10-25 NOTE — ED Notes (Signed)
Dr. Dahlia Client in to speak with pt and family.

## 2016-10-25 NOTE — ED Notes (Signed)
Pt requesting additional pain medication. md notified.  

## 2016-10-25 NOTE — ED Notes (Signed)
Pt updated on plan of care. Pt verbalizes understanding.  

## 2016-10-26 ENCOUNTER — Encounter: Payer: Self-pay | Admitting: Gastroenterology

## 2016-10-27 LAB — SURGICAL PATHOLOGY

## 2017-05-16 ENCOUNTER — Other Ambulatory Visit: Payer: Self-pay | Admitting: Certified Nurse Midwife

## 2017-05-17 NOTE — Telephone Encounter (Signed)
Please advise for refill. Thank you.  

## 2017-05-24 ENCOUNTER — Other Ambulatory Visit: Payer: Self-pay | Admitting: Certified Nurse Midwife

## 2017-05-24 NOTE — Telephone Encounter (Signed)
Left msg for pt that this rx was sent to [facility] [place] for 90 day supply on 05/17/17

## 2017-05-24 NOTE — Telephone Encounter (Signed)
Pt is schedule for 06/21/17 for annual. Pt is requesting refill on her Clamara patches. [facility], [place]

## 2017-05-28 ENCOUNTER — Emergency Department
Admission: EM | Admit: 2017-05-28 | Discharge: 2017-05-28 | Disposition: A | Discharge status: Home / Self Care | Payer: Medicare Other | Attending: Emergency Medicine | Admitting: Emergency Medicine

## 2017-05-28 DIAGNOSIS — Y929 Unspecified place or not applicable: Secondary | ICD-10-CM | POA: Insufficient documentation

## 2017-05-28 DIAGNOSIS — Y939 Activity, unspecified: Secondary | ICD-10-CM | POA: Diagnosis not present

## 2017-05-28 DIAGNOSIS — I10 Essential (primary) hypertension: Secondary | ICD-10-CM | POA: Insufficient documentation

## 2017-05-28 DIAGNOSIS — S161XXA Strain of muscle, fascia and tendon at neck level, initial encounter: Secondary | ICD-10-CM | POA: Insufficient documentation

## 2017-05-28 DIAGNOSIS — S199XXA Unspecified injury of neck, initial encounter: Secondary | ICD-10-CM | POA: Diagnosis present

## 2017-05-28 DIAGNOSIS — Y999 Unspecified external cause status: Secondary | ICD-10-CM | POA: Insufficient documentation

## 2017-05-28 DIAGNOSIS — Z79899 Other long term (current) drug therapy: Secondary | ICD-10-CM | POA: Diagnosis not present

## 2017-05-28 NOTE — Discharge Instructions (Signed)
Please seek medical attention for any high fevers, chest pain, shortness of breath, change in behavior, persistent vomiting, bloody stool or any other new or concerning symptoms.  
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
## 2017-06-21 ENCOUNTER — Encounter: Payer: Self-pay | Admitting: Certified Nurse Midwife

## 2017-06-21 ENCOUNTER — Ambulatory Visit (INDEPENDENT_AMBULATORY_CARE_PROVIDER_SITE_OTHER): Payer: Medicare Other | Admitting: Certified Nurse Midwife

## 2017-06-21 VITALS — BP 120/70 | HR 59 | Ht 65.0 in | Wt 193.0 lb

## 2017-06-21 DIAGNOSIS — Z1231 Encounter for screening mammogram for malignant neoplasm of breast: Secondary | ICD-10-CM

## 2017-06-21 DIAGNOSIS — E785 Hyperlipidemia, unspecified: Secondary | ICD-10-CM | POA: Insufficient documentation

## 2017-06-21 DIAGNOSIS — Z1239 Encounter for other screening for malignant neoplasm of breast: Secondary | ICD-10-CM

## 2017-06-21 DIAGNOSIS — M1990 Unspecified osteoarthritis, unspecified site: Secondary | ICD-10-CM | POA: Insufficient documentation

## 2017-06-21 DIAGNOSIS — I10 Essential (primary) hypertension: Secondary | ICD-10-CM | POA: Insufficient documentation

## 2017-06-21 DIAGNOSIS — R102 Pelvic and perineal pain: Secondary | ICD-10-CM

## 2017-06-21 DIAGNOSIS — F32A Depression, unspecified: Secondary | ICD-10-CM | POA: Insufficient documentation

## 2017-06-21 DIAGNOSIS — R350 Frequency of micturition: Secondary | ICD-10-CM

## 2017-06-21 DIAGNOSIS — K219 Gastro-esophageal reflux disease without esophagitis: Secondary | ICD-10-CM | POA: Insufficient documentation

## 2017-06-21 DIAGNOSIS — Z01419 Encounter for gynecological examination (general) (routine) without abnormal findings: Secondary | ICD-10-CM

## 2017-06-21 DIAGNOSIS — Z23 Encounter for immunization: Secondary | ICD-10-CM | POA: Diagnosis not present

## 2017-06-21 DIAGNOSIS — G4730 Sleep apnea, unspecified: Secondary | ICD-10-CM | POA: Insufficient documentation

## 2017-06-21 DIAGNOSIS — F329 Major depressive disorder, single episode, unspecified: Secondary | ICD-10-CM | POA: Insufficient documentation

## 2017-06-21 DIAGNOSIS — H8109 Meniere's disease, unspecified ear: Secondary | ICD-10-CM | POA: Insufficient documentation

## 2017-06-21 DIAGNOSIS — R1024 Suprapubic pain: Secondary | ICD-10-CM

## 2017-06-21 DIAGNOSIS — H409 Unspecified glaucoma: Secondary | ICD-10-CM | POA: Insufficient documentation

## 2017-06-21 LAB — POCT URINALYSIS DIPSTICK
Bilirubin, UA: NEGATIVE
Blood, UA: NEGATIVE
Glucose, UA: NEGATIVE
Ketones, UA: NEGATIVE
Nitrite, UA: NEGATIVE
PH UA: 5 (ref 5.0–8.0)
SPEC GRAV UA: 1.025 (ref 1.010–1.025)
UROBILINOGEN UA: 0.2 U/dL

## 2017-06-21 MED ORDER — CEFIXIME 400 MG PO CAPS: 400.0000 mg | ORAL_CAPSULE | Freq: Every day | ORAL | 0 refills | Status: DC

## 2017-06-21 MED ORDER — ESTRADIOL 0.05 MG/24HR TD PTWK: 0.0500 mg | MEDICATED_PATCH | TRANSDERMAL | 3 refills | Status: DC

## 2017-06-21 NOTE — Progress Notes (Signed)
Gynecology Annual Exam  PCP: Blair Dolphin, MD  Chief Complaint:  Chief Complaint  Patient presents with  . Gynecologic Exam    History of Present Illness:Tiffany Cisneros is a 66 year old Caucasian/White female, [place], who presents for her annual exam. She complains of lower abdominal pain. Initially had left lower back pain starting on 16 November that worsened on Saturday 17 November. On Sunday the pain in her back moved around her left flank to her suprapubic area. The pain has lessened and she is no longer having back pain. The pain has been accompanied by urinary frequency, feeling like she is not emptying her bladder. On Saturday she was urinating every 15 minutes, now every 30-45 minutes. Denies dysuria or hematuria. Moving, coughing, bending increases pain in SP area. Has felt nauseous and has had decreased appetite since this pain started. Had one loose stool this AM. No blood in stool. No fever/chills. She is postmenopausal and has a hx of a TAH & BSO and currently uses the 0.05 Climara patch. Tried decreasing her Climara to 0.0375, but patient experienced more hot flashes and wanted to return to 0.05 dose.  She has had no spotting.   The patient's past medical history is notable for a history of hypertension, GERD/ gastritis, endometriosis, Meniere's, arthritis, hyperlipidemia, sleep apnea, and depression.  Since her last annual GYN exam dated 06/12/2016, she has had no other changes in her health.    Her last Pap smear was in 2005. She chooses not to have any further Pap smears since her hysterectomy.  She is not sexually active.   Her most recent mammogram obtained on 07/21/2016 was benign.  There is a positive history of breast cancer in her sister (69). Genetic testing has not been done.  There is no family history of ovarian cancer.  The patient does not do monthly self breast exams.  She had a colonoscopy in March 2018 that was normal per her report. She was  referred for colon cancer screening and concerns for diarrhea. She also had a EGD showing chronic gastritis.  She had a recent DEXA scan obtained in 9/26/ 2016 that showed mild osteopenia of the femur neck (-1.4).  The patient does not smoke.  The patient does not drink alcohol.  The patient does not use illegal drugs.  The patient has not been exercising. Last year was exercising twice a week with a trainer.  The patient does get adequate calcium in her diet. She had a recent cholesterol screen in 2018 by PCP Dr Emily Filbert that was abnormal  Review of Systems: Review of Systems  Constitutional: Negative for chills, fever and weight loss.  HENT: Negative for congestion, sinus pain and sore throat.   Eyes: Negative for blurred vision and pain.  Respiratory: Negative for hemoptysis, shortness of breath and wheezing.   Cardiovascular: Negative for chest pain, palpitations and leg swelling.  Gastrointestinal: Positive for abdominal pain and nausea. Negative for blood in stool, diarrhea, heartburn and vomiting.  Genitourinary: Positive for flank pain, frequency and urgency. Negative for dysuria and hematuria.  Musculoskeletal: Positive for back pain. Negative for joint pain and myalgias.  Skin: Negative for itching and rash.  Neurological: Negative for dizziness, tingling and headaches.  Endo/Heme/Allergies: Negative for environmental allergies and polydipsia. Does not bruise/bleed easily.       Negative for hirsutism   Psychiatric/Behavioral: Negative for depression. The patient is not nervous/anxious and does not have insomnia.     Past Medical History:  Past Medical History:  Diagnosis Date  . Arthritis   .  Depression   . GERD (gastroesophageal reflux disease)   . Glaucoma   . History of abnormal mammogram 2011   benign cyst right breast  . Hyperlipidemia   . Hypertension   . Meniere disease   . Sleep apnea    on CPAP    Past Surgical History:  Past Surgical History:  Procedure Laterality Date  . ABDOMINAL HYSTERECTOMY  02/17/1995   TAH BSO fibroids, adenomyosis and endometriosis  . APPENDECTOMY    . CARDIAC CATHETERIZATION  2008   chest pain  . CHOLECYSTECTOMY    . COLONOSCOPY WITH PROPOFOL N/A 10/23/2016   Performed by Lollie Sails, MD at [facility]  . CRYOTHERAPY  05/1993   dysplasia  . ESOPHAGOGASTRODUODENOSCOPY (EGD) WITH PROPOFOL N/A 10/23/2016   Performed by Lollie Sails, MD at [facility] ENDOSCOPY    Family History:  Family History  Problem Relation Age of Onset  . Breast cancer Sister 46  . Heart disease Sister   . Hypertension Mother   . Hyperlipidemia Mother   . CAD Father   . Stroke Father 19  . Hypertension Father   . CAD Brother   . Parkinson's disease Sister     Social History:  Social History   Socioeconomic History  . Marital status: Married    Spouse name: Not on file  . Number of children: 2  . Years of education: Not on file  . Highest education level: Not on file  Social Needs  . Financial resource strain: Not on file  . Food insecurity - worry: Not on file  . Food insecurity - inability: Not on file  . Transportation needs - medical: Not on file  . Transportation needs - non-medical: Not on file  Occupational History  . Occupation: Retired  Tobacco Use  . Smoking status: Never Smoker  . Smokeless tobacco: Never Used  Substance and Sexual Activity  . Alcohol use: No  . Drug use: No  . Sexual activity: Not Currently    Birth control/protection: Post-menopausal  Other Topics Concern  . Not on file  Social History Narrative  . Not on file    Allergies:  Allergies  Allergen Reactions  . Codeine   . Statins Other (See  Comments)    Muscle pain    Medications: Prior to Admission medications   Medication Sig Start Date End Date Taking? Authorizing Provider  acetaminophen (TYLENOL) 500 MG tablet Take 1,000 mg by mouth every 6 (six) hours as needed for mild pain or  headache.    Yes [provider]  amLODipine-benazepril (LOTREL) 5-10 MG capsule Take 1 capsule by mouth daily.   Yes [provider]  estradiol (CLIMARA - DOSED IN MG/24 HR) 0.05 mg/24hr patch APPLY ONE PATCH TOPICALLY ONCE A WEEK 05/17/17  Yes Dalia Heading, CNM  hydrochlorothiazide (HYDRODIURIL) 25 MG tablet Take 25 mg by mouth daily as needed (fluid).    Yes [provider]  meclizine (ANTIVERT) 25 MG tablet Take 25 mg by mouth 3 (three) times daily as needed for dizziness.   Yes [provider]  Probiotic Product (PROBIOTIC-10) CAPS Take by mouth.   Yes [provider]  sertraline (ZOLOFT) 50 MG tablet Take 50 mg by mouth daily.   Yes [provider]    Physical Exam Vitals: BP 120/70   Pulse (!) 59   Ht 5\' 5"  (1.651 m)   Wt 193 lb (87.5 kg)   BMI 32.12 kg/m   General: WF in NAD HEENT: normocephalic, anicteric Neck: no thyroid enlargement, no palpable nodules, no cervical lymphadenopathy  Pulmonary: No increased work of breathing, CTAB Cardiovascular: RRR, without murmur  Breast: Breast symmetrical, no tenderness, no palpable nodules or masses, no skin or nipple retraction present, no nipple discharge.  No axillary, infraclavicular or supraclavicular lymphadenopathy. Abdomen: Soft, obese, tenderness to palpation in lower abdomen, Guarding present when palpating most of abdomen, RUQ NT.   Umbilicus without lesions.  No hepatomegaly or masses palpable. No evidence of hernia Bedside abdomen done: Bladder did not appear distended after voiding.. Genitourinary:  External: Normal external female genitalia.  Normal urethral meatus, normal Bartholin's and Skene's glands.    Vagina:  Normal vaginal mucosa, no evidence of prolapse.    Cervix:surgically absent   Uterus: surgically absent  Adnexa: difficult exam due to guarding, tender to palpation  Rectal: deferred  Lymphatic: no evidence of inguinal lymphadenopathy Extremities: no edema, erythema, or tenderness Neurologic: Grossly intact Psychiatric: mood appropriate, affect full  Results for orders placed or performed in visit on 06/21/17 (from the past 24 hour(s))  POCT Urinalysis Dipstick     Status: Abnormal   Collection Time: 06/21/17  3:22 PM  Result Value Ref Range   Color, UA yellow gold    Clarity, UA clear    Glucose, UA negative    Bilirubin, UA negative    Ketones, UA negative    Spec Grav, UA 1.025 1.010 - 1.025   Blood, UA negative    pH, UA 5.0 5.0 - 8.0   Protein, UA trace    Urobilinogen, UA 0.2 0.2 or 1.0 E.U./dL   Nitrite, UA negative    Leukocytes, UA Trace (A) Negative     Assessment: 66 y.o. [id number] presents for annual exam with left flank/ lower abdominal pain Urinary frequency  R/O UTI, R/O urinary stones   Plan:   1) Breast cancer screening - recommend monthly self breast exam and annual screening mammograms. Mammogram was ordered today. Patient to call [facility] for appt.  2) Urine culture sent. Will start Suprax 400 mgm daily while awaiting culture results. If culture negative refer to urology and consider CT scan. FU pending results and symptoms  3) Colon cancer screening: UTD  4 Routine healthcare maintenance including cholesterol and diabetes screening managed by PCP    Dalia Heading, CNM

## 2017-06-23 LAB — URINE CULTURE

## 2017-06-28 ENCOUNTER — Telehealth: Payer: Self-pay | Admitting: Certified Nurse Midwife

## 2017-06-28 NOTE — Telephone Encounter (Signed)
Patient called with results of urine culture: 10-25K mixed urogenital flora. Patient reports that symptoms have resolved on her Suprax. Advised to complete course of antibiotics.  Dalia Heading, CNM

## 2017-07-09 ENCOUNTER — Emergency Department: Payer: Medicare Other

## 2017-07-09 ENCOUNTER — Encounter: Payer: Self-pay | Admitting: Emergency Medicine

## 2017-07-09 ENCOUNTER — Emergency Department
Admission: EM | Admit: 2017-07-09 | Discharge: 2017-07-10 | Disposition: A | Discharge status: Home / Self Care | Payer: Medicare Other | Attending: Emergency Medicine | Admitting: Emergency Medicine

## 2017-07-09 ENCOUNTER — Other Ambulatory Visit: Payer: Self-pay

## 2017-07-09 DIAGNOSIS — Z79899 Other long term (current) drug therapy: Secondary | ICD-10-CM | POA: Insufficient documentation

## 2017-07-09 DIAGNOSIS — I10 Essential (primary) hypertension: Secondary | ICD-10-CM | POA: Insufficient documentation

## 2017-07-09 DIAGNOSIS — R42 Dizziness and giddiness: Secondary | ICD-10-CM | POA: Diagnosis not present

## 2017-07-09 DIAGNOSIS — R1030 Lower abdominal pain, unspecified: Secondary | ICD-10-CM | POA: Insufficient documentation

## 2017-07-09 DIAGNOSIS — R102 Pelvic and perineal pain: Secondary | ICD-10-CM

## 2017-07-09 LAB — COMPREHENSIVE METABOLIC PANEL
ALBUMIN: 3.2 g/dL — AB (ref 3.5–5.0)
ALT: 18 U/L (ref 14–54)
ANION GAP: 8 (ref 5–15)
AST: 21 U/L (ref 15–41)
Alkaline Phosphatase: 69 U/L (ref 38–126)
BILIRUBIN TOTAL: 0.6 mg/dL (ref 0.3–1.2)
BUN: 21 mg/dL — AB (ref 6–20)
CHLORIDE: 108 mmol/L (ref 101–111)
CO2: 22 mmol/L (ref 22–32)
Calcium: 8.6 mg/dL — ABNORMAL LOW (ref 8.9–10.3)
Creatinine, Ser: 1.43 mg/dL — ABNORMAL HIGH (ref 0.44–1.00)
GFR calc Af Amer: 43 mL/min — ABNORMAL LOW (ref >60)
GFR, EST NON AFRICAN AMERICAN: 37 mL/min — AB (ref >60)
GLUCOSE: 129 mg/dL — AB (ref 65–99)
POTASSIUM: 4 mmol/L (ref 3.5–5.1)
Sodium: 138 mmol/L (ref 135–145)
TOTAL PROTEIN: 6.3 g/dL — AB (ref 6.5–8.1)

## 2017-07-09 LAB — CBC
HEMATOCRIT: 43.5 % (ref 35.0–47.0)
HEMOGLOBIN: 15 g/dL (ref 12.0–16.0)
MCH: 32.9 pg (ref 26.0–34.0)
MCHC: 34.4 g/dL (ref 32.0–36.0)
MCV: 95.7 fL (ref 80.0–100.0)
Platelets: 256 10*3/uL (ref 150–440)
RBC: 4.55 MIL/uL (ref 3.80–5.20)
RDW: 12.7 % (ref 11.5–14.5)
WBC: 8 10*3/uL (ref 3.6–11.0)

## 2017-07-09 LAB — URINALYSIS, COMPLETE (UACMP) WITH MICROSCOPIC
BACTERIA UA: NONE SEEN
BILIRUBIN URINE: NEGATIVE
Glucose, UA: NEGATIVE mg/dL
Hgb urine dipstick: NEGATIVE
KETONES UR: NEGATIVE mg/dL
LEUKOCYTES UA: NEGATIVE
NITRITE: NEGATIVE
PROTEIN: NEGATIVE mg/dL
Specific Gravity, Urine: 1.011 (ref 1.005–1.030)
pH: 6 (ref 5.0–8.0)

## 2017-07-09 MED ORDER — IOPAMIDOL (ISOVUE-300) INJECTION 61%
30.0000 mL | Freq: Once | INTRAVENOUS | Status: AC | PRN
  Administered 2017-07-09: 30 mL via ORAL

## 2017-07-09 MED ORDER — SODIUM CHLORIDE 0.9 % IV BOLUS (SEPSIS)
1000.0000 mL | Freq: Once | INTRAVENOUS | Status: AC
  Administered 2017-07-09: 1000 mL via INTRAVENOUS

## 2017-07-09 MED ORDER — ONDANSETRON HCL 4 MG/2ML IJ SOLN
4.0000 mg | Freq: Once | INTRAMUSCULAR | Status: AC
  Administered 2017-07-09: 4 mg via INTRAVENOUS
  Filled 2017-07-09: qty 2

## 2017-07-09 MED ORDER — DIPHENHYDRAMINE HCL 50 MG/ML IJ SOLN
25.0000 mg | Freq: Once | INTRAMUSCULAR | Status: AC
  Administered 2017-07-09: 25 mg via INTRAVENOUS
  Filled 2017-07-09: qty 1

## 2017-07-09 MED ORDER — METOCLOPRAMIDE HCL 5 MG/ML IJ SOLN
10.0000 mg | Freq: Once | INTRAMUSCULAR | Status: AC
  Administered 2017-07-09: 10 mg via INTRAVENOUS
  Filled 2017-07-09: qty 2

## 2017-07-09 NOTE — ED Provider Notes (Addendum)
[facility] Emergency Department Provider Note  ____________________________________________  Time seen: Approximately 11:40 PM  I have reviewed the triage vital signs and the nursing notes.   HISTORY  Chief Complaint Dizziness    HPI MIKHIA DUSEK is a 66 y.o. female who complains of lightheadedness and dizziness, worse standing up. No syncope, but became so lightheaded and felt like she was given a pass out that her husband had to assist her back to a seated position. She has had episodes of vertigo in the past, but this feels different and worse. She has also had episodes of dehydration in the past but she feels like she's been eating and drinking pretty well recently. Denies any dysuria frequency urgency but she was treated for UTI 1 week ago which improved her symptoms. Denies vomiting or diarrhea. No fevers chills or other acute symptoms. Denies any paresthesias or weakness. No acute vision changes or headaches. No trauma. Symptoms are intermittent, severe when present, worse standing up, better sitting and lying down.     Past Medical History:  Diagnosis Date  . Arthritis   . Depression   . GERD (gastroesophageal reflux disease)   . Glaucoma   . History of abnormal mammogram 2011   benign cyst right breast  . Hyperlipidemia   . Hypertension   . Meniere disease   . Sleep apnea    on CPAP     Patient Active Problem List   Diagnosis Date Noted  . Essential hypertension 06/21/2017  . Hyperlipidemia   . Sleep apnea   . Meniere disease   . Glaucoma   . GERD (gastroesophageal reflux disease)   . Depression   . Arthritis      Past Surgical History:  Procedure Laterality Date  . ABDOMINAL HYSTERECTOMY  02/17/1995   TAH BSO fibroids, adenomyosis and endometriosis  . APPENDECTOMY    . CARDIAC CATHETERIZATION  2008   chest pain  . CHOLECYSTECTOMY    . COLONOSCOPY WITH PROPOFOL N/A 10/23/2016   Procedure: COLONOSCOPY WITH PROPOFOL;   Surgeon: Lollie Sails, MD;  Location: [facility] ENDOSCOPY;  Service: Endoscopy;  Laterality: N/A;  . CRYOTHERAPY  05/1993   dysplasia  . ESOPHAGOGASTRODUODENOSCOPY (EGD) WITH PROPOFOL N/A 10/23/2016   Procedure: ESOPHAGOGASTRODUODENOSCOPY (EGD) WITH PROPOFOL;  Surgeon: Lollie Sails, MD;  Location[facility] ENDOSCOPY;  Service: Endoscopy;  Laterality: N/A;     Prior to Admission medications   Medication Sig Start Date End Date Taking? Authorizing Provider  acetaminophen (TYLENOL) 500 MG tablet Take 1,000 mg by mouth every 6 (six) hours as needed for mild pain or headache.     [provider]  amLODipine-benazepril (LOTREL) 5-10 MG capsule Take 1 capsule by mouth daily.    [provider]  Cefixime (SUPRAX) 400 MG CAPS capsule Take 1 capsule (400 mg total) daily by mouth. 06/21/17   Dalia Heading, CNM  estradiol (CLIMARA - DOSED IN MG/24 HR) 0.05 mg/24hr patch Place 1 patch (0.05 mg total) once a week onto the skin. 06/21/17   Dalia Heading, CNM  hydrochlorothiazide (HYDRODIURIL) 25 MG tablet Take 25 mg by mouth daily as needed (fluid).     [provider]  meclizine (ANTIVERT) 25 MG tablet Take 25 mg by mouth 3 (three) times daily as needed for dizziness.    [provider]  Probiotic Product (PROBIOTIC-10) CAPS Take by mouth.    [provider]  sertraline (ZOLOFT) 50 MG tablet Take 50 mg by mouth daily.    [provider]     Allergies Codeine and Statins   Family History  Problem Relation Age of Onset  . Breast cancer Sister 70  . Heart disease Sister   . Hypertension Mother   . Hyperlipidemia Mother   . CAD Father   . Stroke Father 59  . Hypertension Father   . CAD Brother   . Parkinson's disease Sister     Social History Social History   Tobacco Use  . Smoking status: Never Smoker  . Smokeless tobacco: Never Used  Substance Use Topics  . Alcohol use: No  . Drug use: No    Review of  Systems  Constitutional:   No fever or chills.  ENT:   No sore throat. No rhinorrhea. Cardiovascular:   No chest pain or syncope. Respiratory:   No dyspnea or cough. Gastrointestinal:   Positive for low abdominal pain without vomiting or diarrhea.  Musculoskeletal:   Negative for focal pain or swelling All other systems reviewed and are negative except as documented above in ROS and HPI.  ____________________________________________   PHYSICAL EXAM:  VITAL SIGNS: ED Triage Vitals  Enc Vitals Group     BP 07/09/17 2030 (!) 158/74     Pulse Rate 07/09/17 2030 (!) 56     Resp 07/09/17 2030 15     Temp 07/09/17 2030 98.1 F (36.7 C)     Temp Source 07/09/17 2030 Oral     SpO2 07/09/17 2030 95 %     Weight 07/09/17 2031 185 lb (83.9 kg)     Height 07/09/17 2031 5\' 5"  (1.651 m)     Head Circumference --      Peak Flow --      Pain Score --      Pain Loc --      Pain Edu? --      Excl. in [place]? --     Vital signs reviewed, nursing assessments reviewed.   Constitutional:   Alert and oriented. Well appearing and in no distress. Eyes:   No scleral icterus.  EOMI. No nystagmus. No conjunctival pallor. PERRL. ENT   Head:   Normocephalic and atraumatic.   Nose:   No congestion/rhinnorhea.    Mouth/Throat:   MMM, no pharyngeal erythema. No peritonsillar mass.    Neck:   No meningismus. Full ROM. Hematological/Lymphatic/Immunilogical:   No cervical lymphadenopathy. Cardiovascular:   RRR. Symmetric bilateral radial and DP pulses.  No murmurs.  Respiratory:   Normal respiratory effort without tachypnea/retractions. Breath sounds are clear and equal bilaterally. No wheezes/rales/rhonchi. Gastrointestinal:   Soft with suprapubic tenderness. Non distended. There is no CVA tenderness.  No rebound, rigidity, or guarding. Genitourinary:   deferred Musculoskeletal:   Normal range of motion in all extremities. No joint effusions.  No lower extremity tenderness.  No  edema. Neurologic:   Normal speech and language.  Motor grossly intact. Cranial nerves III through XII intact No gross focal neurologic deficits are appreciated.  Skin:    Skin is warm, dry and intact. No rash noted.  No petechiae, purpura, or bullae.  ____________________________________________    LABS (pertinent positives/negatives) (all labs ordered are listed, but only abnormal results are displayed) Labs Reviewed  URINALYSIS, COMPLETE (UACMP) WITH MICROSCOPIC - Abnormal; Notable for the following components:      Result Value   Color, Urine YELLOW (*)    APPearance CLEAR (*)    Squamous Epithelial / LPF 0-5 (*)    All other components within normal limits  COMPREHENSIVE METABOLIC  PANEL - Abnormal; Notable for the following components:   Glucose, Bld 129 (*)    BUN 21 (*)    Creatinine, Ser 1.43 (*)    Calcium 8.6 (*)    Total Protein 6.3 (*)    Albumin 3.2 (*)    GFR calc non Af Amer 37 (*)    GFR calc Af Amer 43 (*)    All other components within normal limits  CBC  CBG MONITORING, ED   ____________________________________________   EKG  Interpreted by me  Date: 07/09/2017  Rate: 55  Rhythm: normal sinus rhythm  QRS Axis: normal  Intervals: normal  ST/T Wave abnormalities: normal  Conduction Disutrbances: none  Narrative Interpretation: unremarkable      ____________________________________________    RADIOLOGY  No results found.  ____________________________________________   PROCEDURES Procedures  ____________________________________________   DIFFERENTIAL DIAGNOSIS  Stroke, carotid dissection, vertebral artery dissection, diverticulitis, intra-abdominal abscess  CLINICAL IMPRESSION / ASSESSMENT AND PLAN / ED COURSE  Pertinent labs & imaging results that were available during my care of the patient were reviewed by me and considered in my medical decision making (see chart for details).     Clinical Course as of Jul 10 2355   Fri Jul 09, 2017  2211 ?dehydration vs less likely CNS dz vs intraabdominal pathology. Check OSVS, IVF.   [PS]  2245 OSVS nl. Will proceed with CT a/p due to ongoing pain, age. MRI/MRA brain due to neurologic /cns symptoms, subacute/recurrent  [PS]  2351 Assuming care from Dr. Joni Fears.  In short, Tiffany Cisneros is a 66 y.o. female with a chief complaint of vertigo/lightheadedness as well as abdominal pain.  Refer to the original H&P for additional details.  The current plan of care is to follow up imaging and reassess.   [CF]    Clinical Course User Index [CF] Hinda Kehr, MD [PS] Carrie Mew, MD    ----------------------------------------- 11:43 PM on 07/09/2017 -----------------------------------------  Care signed out to Dr. Karma Greaser to f/u CT a/p and MRI/MRA brain. IVF ongoing for hydration. Roughly Baseline CKD.   ____________________________________________   FINAL CLINICAL IMPRESSION(S) / ED DIAGNOSES    Final diagnoses:  Orthostatic dizziness  Suprapubic pain      This SmartLink is deprecated. Use AVSMEDLIST instead to display the medication list for a patient.   Portions of this note were generated with dragon dictation software. Dictation errors may occur despite best attempts at proofreading.    Carrie Mew, MD 07/09/17 Darci Needle    Carrie Mew, MD 07/09/17 [phone]

## 2017-07-09 NOTE — ED Provider Notes (Signed)
[name]  Result Date: 07/10/2017 CLINICAL DATA:  66 y/o  F; lightheadedness and dizziness. EXAM: MRI HEAD WITHOUT CONTRAST MRA HEAD WITHOUT CONTRAST TECHNIQUE: Multiplanar, multiecho pulse sequences of the brain and surrounding structures were obtained without intravenous contrast. Angiographic images of the head were obtained using MRA technique without contrast. COMPARISON:  None. FINDINGS: MRI HEAD FINDINGS Brain: No acute infarction, hemorrhage, hydrocephalus, extra-axial collection or mass lesion. Few nonspecific foci of T2 FLAIR hyperintense signal abnormality in subcortical and periventricular white matter are compatible with mild chronic microvascular ischemic changes for age. Vascular: As below. Skull and upper cervical spine: Normal marrow signal. Sinuses/Orbits: Negative. Other: None. MRA HEAD FINDINGS Internal carotid arteries:  Patent. Anterior cerebral arteries:  Patent. Middle cerebral arteries: Patent. Anterior communicating artery: Patent. Posterior communicating arteries: Patent left. No right identified, likely hypoplastic or absent. Posterior cerebral arteries:  Patent. Basilar artery:  Patent. Vertebral arteries:  Patent. No evidence of high-grade stenosis, large vessel occlusion, or aneurysm identified. IMPRESSION: 1. No acute intracranial abnormality. 2. Mild chronic microvascular ischemic changes of the brain. 3. Normal MRA of the head. Electronically Signed   By: Kristine Garbe M.D.   On: 07/10/2017 00:41   [name]  Result Date: 07/10/2017 CLINICAL DATA:  66 y/o  F; lightheadedness and dizziness. EXAM: MRI HEAD WITHOUT CONTRAST MRA HEAD WITHOUT CONTRAST TECHNIQUE: Multiplanar, multiecho pulse sequences of the brain and surrounding structures were obtained without intravenous contrast. Angiographic images of the head were obtained using MRA technique without contrast. COMPARISON:  None. FINDINGS: MRI HEAD FINDINGS Brain: No acute infarction,  hemorrhage, hydrocephalus, extra-axial collection or mass lesion. Few nonspecific foci of T2 FLAIR hyperintense signal abnormality in subcortical and periventricular white matter are compatible with mild chronic microvascular ischemic changes for age. Vascular: As below. Skull and upper cervical spine: Normal marrow signal. Sinuses/Orbits: Negative. Other: None. MRA HEAD FINDINGS Internal carotid arteries:  Patent. Anterior cerebral arteries:  Patent. Middle cerebral arteries: Patent. Anterior communicating artery: Patent. Posterior communicating arteries: Patent left. No right identified, likely hypoplastic or absent. Posterior cerebral arteries:  Patent. Basilar artery:  Patent. Vertebral arteries:  Patent. No evidence of high-grade stenosis, large vessel occlusion, or aneurysm identified. IMPRESSION: 1. No acute intracranial abnormality. 2. Mild chronic microvascular ischemic changes of the brain. 3. Normal MRA of the head. Electronically Signed   By: Kristine Garbe M.D.   On: 07/10/2017 00:41   Ct Abdomen Pelvis W Contrast  Result Date: 07/10/2017 CLINICAL DATA:  66 year old female with abdominal pain. EXAM: CT ABDOMEN AND PELVIS WITH CONTRAST TECHNIQUE: Multidetector CT imaging of the abdomen and pelvis was performed using the standard protocol following bolus administration of intravenous contrast. CONTRAST:  38mL ISOVUE-300 IOPAMIDOL (ISOVUE-300) INJECTION 61% COMPARISON:  Abdominal CT dated 10/05/2012 FINDINGS: Lower chest: Minimal bibasilar linear atelectasis/ scarring. The visualized lung bases are otherwise clear. No intra-abdominal free air or free fluid. Hepatobiliary: Probable mild fatty infiltration of the liver. No intrahepatic biliary ductal dilatation. The gallbladder is surgically absent. No retained calcified stone noted in the central CBD. Pancreas: Unremarkable. No pancreatic ductal dilatation or surrounding inflammatory changes. Spleen: Normal in size without focal abnormality.  Adrenals/Urinary Tract: The adrenal glands are unremarkable. Small bilateral parapelvic cysts. There is no hydronephrosis on either side. There is symmetric enhancement and excretion of contrast by both kidneys. The visualized ureters and urinary bladder appear unremarkable. Stomach/Bowel: There is no evidence of bowel obstruction or active inflammation. Appendectomy. Vascular/Lymphatic: Mild aortoiliac atherosclerotic disease. No portal venous gas.  There is no adenopathy. Reproductive: Hysterectomy.  No pelvic mass. Other: Small fat containing umbilical hernia. Musculoskeletal: Bilateral femoral head avascular necrosis. No cortical collapse. No acute osseous pathology. IMPRESSION: 1. No acute intra-abdominal or pelvic pathology. No bowel obstruction or active inflammation. 2.  Aortic Atherosclerosis (ICD10-I70.0). Electronically Signed   By: Anner Crete M.D.   On: 07/10/2017 01:10     Labs Reviewed  URINALYSIS, COMPLETE (UACMP) WITH MICROSCOPIC - Abnormal; Notable for the following components:      Result Value   Color, Urine YELLOW (*)    APPearance CLEAR (*)    Squamous Epithelial / LPF 0-5 (*)    All other components within normal limits  COMPREHENSIVE METABOLIC PANEL - Abnormal; Notable for the following components:   Glucose, Bld 129 (*)    BUN 21 (*)    Creatinine, Ser 1.43 (*)    Calcium 8.6 (*)    Total Protein 6.3 (*)    Albumin 3.2 (*)    GFR calc non Af Amer 37 (*)    GFR calc Af Amer 43 (*)    All other components within normal limits  CBC  CBG MONITORING, ED     Clinical Course as of Jul 10 129  Fri Jul 09, 2017  2211 ?dehydration vs less likely CNS dz vs intraabdominal pathology. Check OSVS, IVF.   [PS]  2245 OSVS nl. Will proceed with CT a/p due to ongoing pain, age. MRI/MRA brain due to neurologic /cns symptoms, subacute/recurrent  [PS]  2351 Assuming care from Dr. Joni Fears.  In short, Tiffany Cisneros is a 66 y.o. female with a chief complaint of  vertigo/lightheadedness as well as abdominal pain.  Refer to the original H&P for additional details.  The current plan of care is to follow up imaging and reassess.   [CF]  Sat Jul 10, 2017  0054 Reassuring MRI/A of brain with no acute abnormalities.  Awaiting CT abd/pelvis results. [name] [CF]  0118 No acute findings on CT abd/pelvis.  Will discuss with patient and provide reassurance. CT Abdomen Pelvis W Contrast [CF]  0126 I had A lengthy discussion with the patient and her family and explained I was not able to identify any acute or emergent conditions.  She has gotten about a half of a liter of fluid but she wants to go home now and states that she feels much better.  I encouraged her to follow-up with her primary physician at the next available opportunity.  I gave my usual and customary return precautions.  [CF]    Clinical Course User Index [CF] Hinda Kehr, MD [PS] Carrie Mew, MD     Final diagnoses:  Orthostatic dizziness  Suprapubic pain      Hinda Kehr, MD 07/10/17 0130

## 2017-07-09 NOTE — ED Notes (Signed)
Patient transported to MRI 

## 2017-07-09 NOTE — ED Triage Notes (Signed)
Pt presents to ED 13 via EMS from home c/o light headedness and dizziness, and near syncopal episode; per EMS, pt has history of vertigo, and VS were BP 197/78, HR 64, Temp 97.7, CBG 172, they gave 4 mg Zofran for nausea; at this time pt is awake, alert and oriented x4, does not appear to be in acute distress, is not complaining of any pain anywhere, does c/o of still feeling light headed, and dizzy as if the "room is spinning".

## 2017-07-10 ENCOUNTER — Encounter: Payer: Self-pay | Admitting: Radiology

## 2017-07-10 ENCOUNTER — Emergency Department: Payer: Medicare Other

## 2017-07-10 DIAGNOSIS — R42 Dizziness and giddiness: Secondary | ICD-10-CM | POA: Diagnosis not present

## 2017-07-10 MED ORDER — IOPAMIDOL (ISOVUE-300) INJECTION 61%
75.0000 mL | Freq: Once | INTRAVENOUS | Status: AC | PRN
  Administered 2017-07-10: 75 mL via INTRAVENOUS

## 2017-07-10 NOTE — ED Notes (Signed)
Reviewed discharge instructions, follow-up care with patient. Patient verbalized understanding of all information reviewed. Patient stable, with no distress noted at this time.    

## 2017-07-10 NOTE — Discharge Instructions (Signed)
As we discussed, your workup today was reassuring.  Though we do not know exactly what is causing your symptoms, it appears that you have no emergent medical condition at this time and that you are safe to go home and follow up as recommended in this paperwork. ° °Please return immediately to the Emergency Department if you develop any new or worsening symptoms that concern you. ° °

## 2017-08-09 ENCOUNTER — Ambulatory Visit
Admission: RE | Admit: 2017-08-09 | Discharge: 2017-08-09 | Disposition: A | Discharge status: Home / Self Care | Payer: Medicare Other | Source: Ambulatory Visit | Attending: Certified Nurse Midwife | Admitting: Certified Nurse Midwife

## 2017-08-09 DIAGNOSIS — Z1239 Encounter for other screening for malignant neoplasm of breast: Secondary | ICD-10-CM

## 2017-08-09 DIAGNOSIS — Z1231 Encounter for screening mammogram for malignant neoplasm of breast: Secondary | ICD-10-CM | POA: Insufficient documentation

## 2017-08-11 ENCOUNTER — Other Ambulatory Visit: Payer: Self-pay | Admitting: Certified Nurse Midwife

## 2017-08-11 DIAGNOSIS — R928 Other abnormal and inconclusive findings on diagnostic imaging of breast: Secondary | ICD-10-CM

## 2017-08-11 DIAGNOSIS — R921 Mammographic calcification found on diagnostic imaging of breast: Secondary | ICD-10-CM

## 2017-08-11 DIAGNOSIS — N6489 Other specified disorders of breast: Secondary | ICD-10-CM

## 2017-08-26 ENCOUNTER — Ambulatory Visit
Admission: RE | Admit: 2017-08-26 | Discharge: 2017-08-26 | Disposition: A | Discharge status: Home / Self Care | Payer: Medicare Other | Source: Ambulatory Visit | Attending: Certified Nurse Midwife | Admitting: Certified Nurse Midwife

## 2017-08-26 ENCOUNTER — Other Ambulatory Visit: Payer: Self-pay | Admitting: Certified Nurse Midwife

## 2017-08-26 DIAGNOSIS — N6489 Other specified disorders of breast: Secondary | ICD-10-CM

## 2017-08-26 DIAGNOSIS — R928 Other abnormal and inconclusive findings on diagnostic imaging of breast: Secondary | ICD-10-CM

## 2017-08-26 DIAGNOSIS — R921 Mammographic calcification found on diagnostic imaging of breast: Secondary | ICD-10-CM

## 2017-09-07 ENCOUNTER — Ambulatory Visit
Admission: RE | Admit: 2017-09-07 | Discharge: 2017-09-07 | Disposition: A | Discharge status: Home / Self Care | Payer: Medicare Other | Source: Ambulatory Visit | Attending: Certified Nurse Midwife | Admitting: Certified Nurse Midwife

## 2017-09-07 DIAGNOSIS — R921 Mammographic calcification found on diagnostic imaging of breast: Secondary | ICD-10-CM | POA: Diagnosis present

## 2017-09-07 DIAGNOSIS — R928 Other abnormal and inconclusive findings on diagnostic imaging of breast: Secondary | ICD-10-CM | POA: Insufficient documentation

## 2017-09-07 HISTORY — PX: BREAST BIOPSY: SHX20

## 2017-09-08 LAB — SURGICAL PATHOLOGY

## 2017-09-26 ENCOUNTER — Encounter: Payer: Self-pay | Admitting: Certified Nurse Midwife

## 2018-02-15 IMAGING — CT CT ANGIO CHEST
1 of 6 series · 18 of 36 positions shown · IV contrast (APPLIED)
Comparison: Prior radiograph from earlier the same day.

CLINICAL DATA: Initial evaluation for acute chest pain status post
endoscopy.

EXAM:
CT ANGIOGRAPHY CHEST WITH CONTRAST
TECHNIQUE: Multidetector CT imaging of the chest was performed using the
standard protocol during bolus administration of intravenous
contrast. Multiplanar CT image reconstructions and MIPs were
obtained to evaluate the vascular anatomy.
CONTRAST:  60 cc of Isovue 370.

[Series 5: thins · axial · 0.64mm/px · z∈[+773,+1009]mm · 18 of 264 slices shown]
[im 14/264  lung]
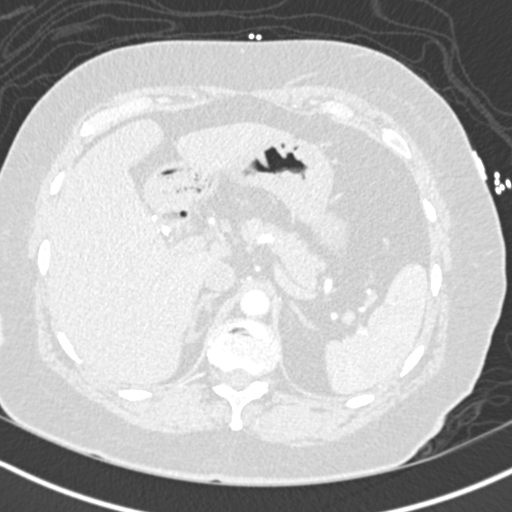
[im 27/264  mediastinal]
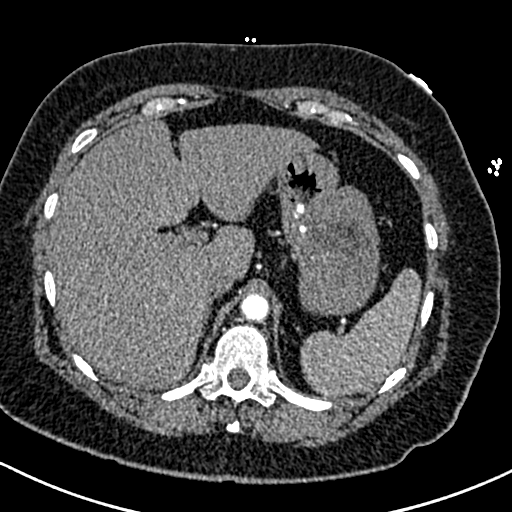
[im 40/264  lung]
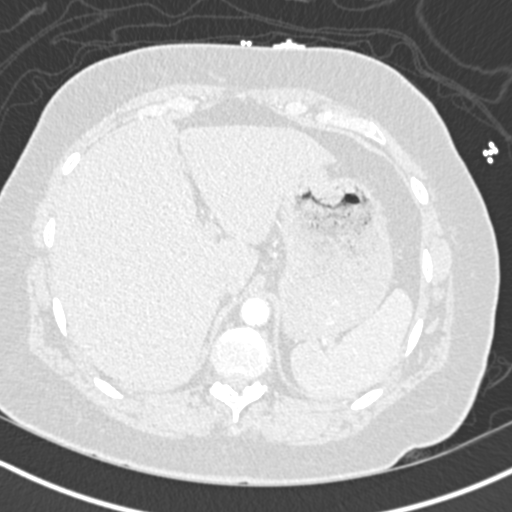
[im 53/264  mediastinal]
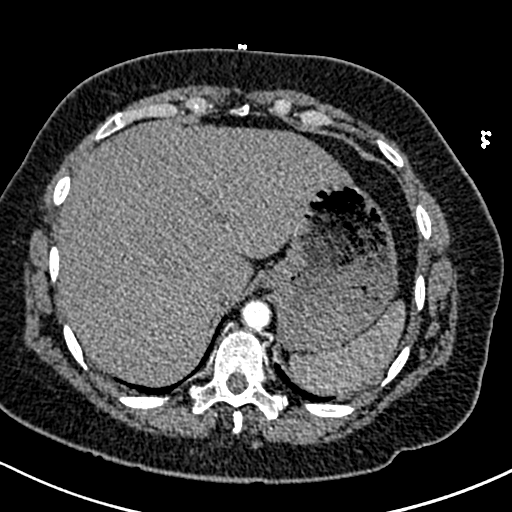
[im 66/264  lung]
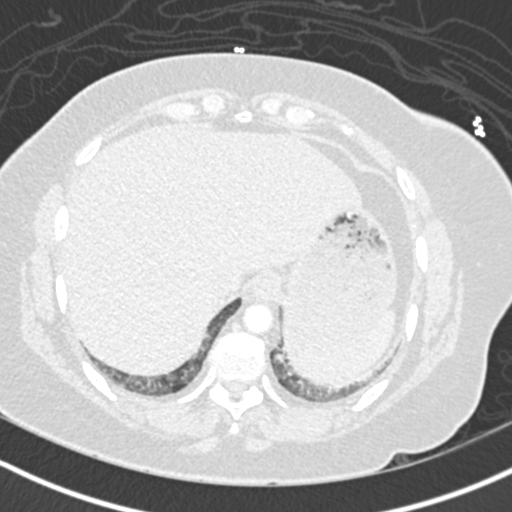
[im 79/264  mediastinal]
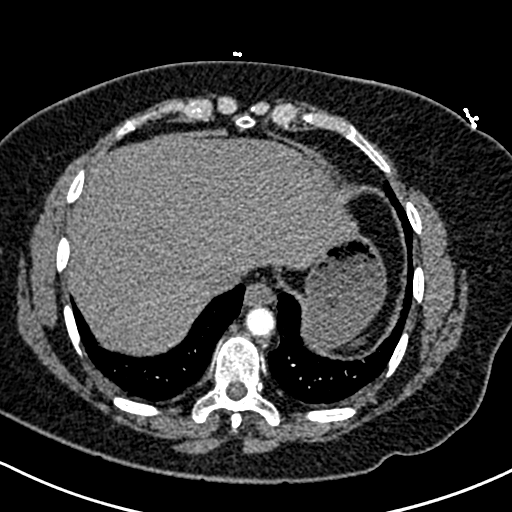
[im 93/264  lung]
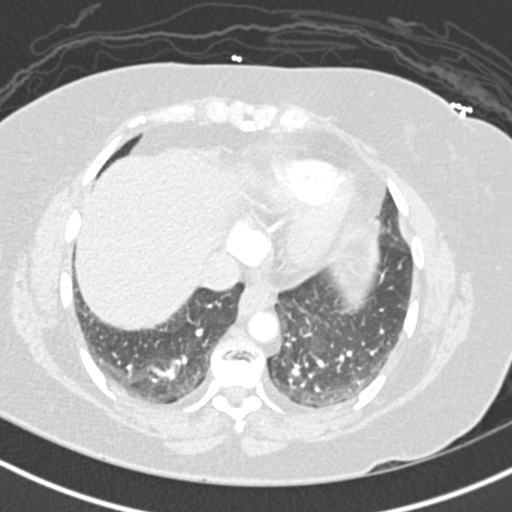
[im 106/264  mediastinal]
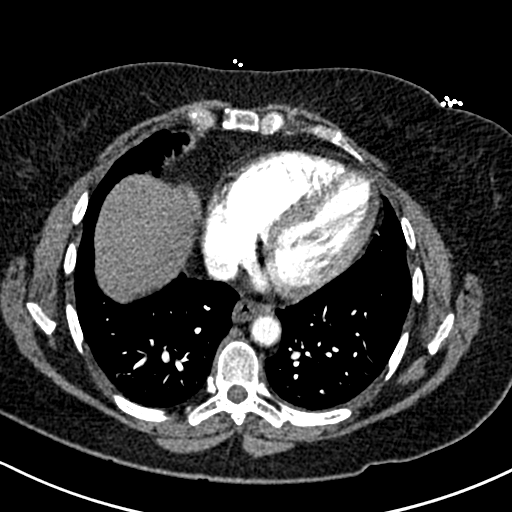
[im 119/264  lung]
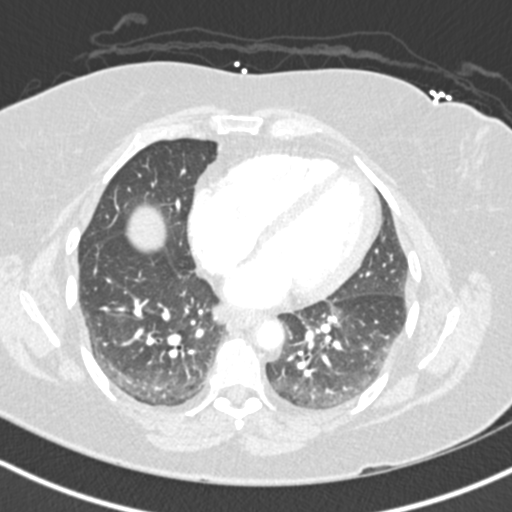
[im 145/264  mediastinal]
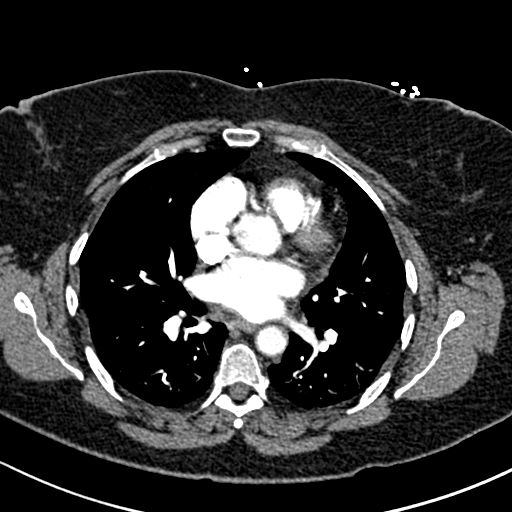
[im 158/264  lung]
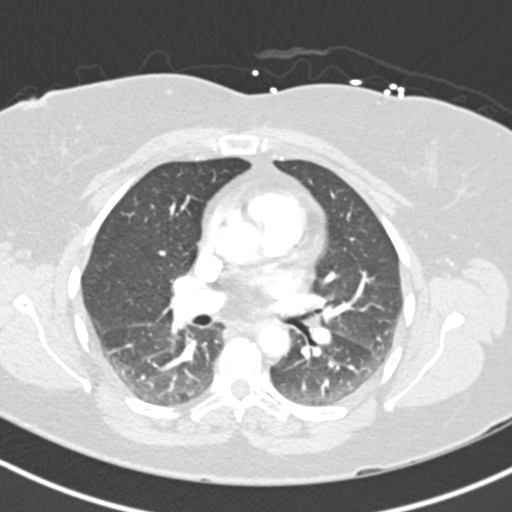
[im 171/264  mediastinal]
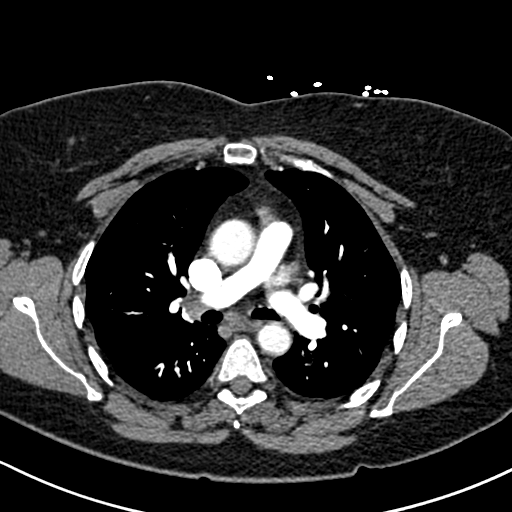
[im 185/264  lung]
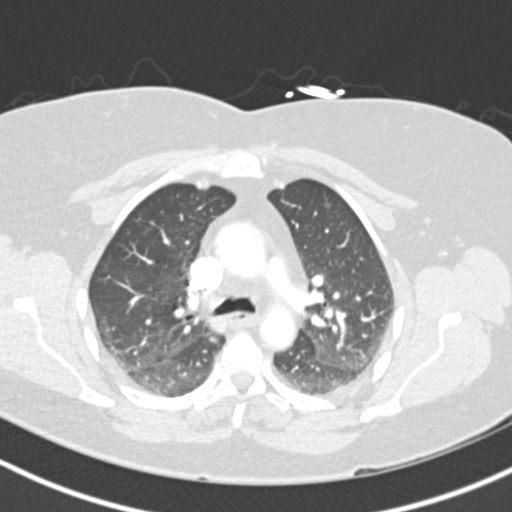
[im 198/264  mediastinal]
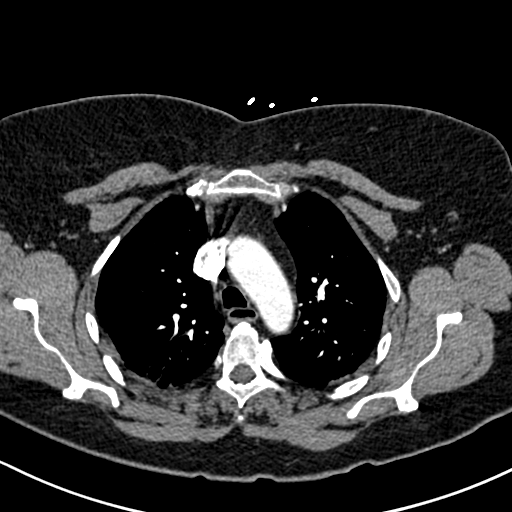
[im 211/264  lung]
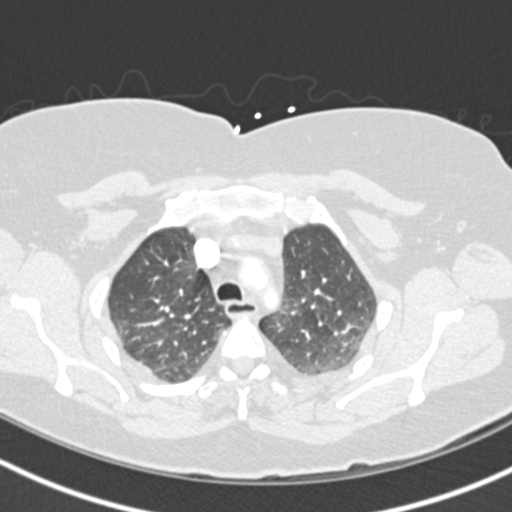
[im 224/264  mediastinal]
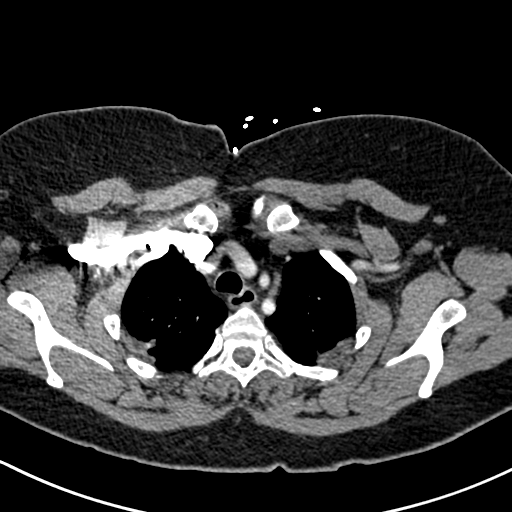
[im 237/264  lung]
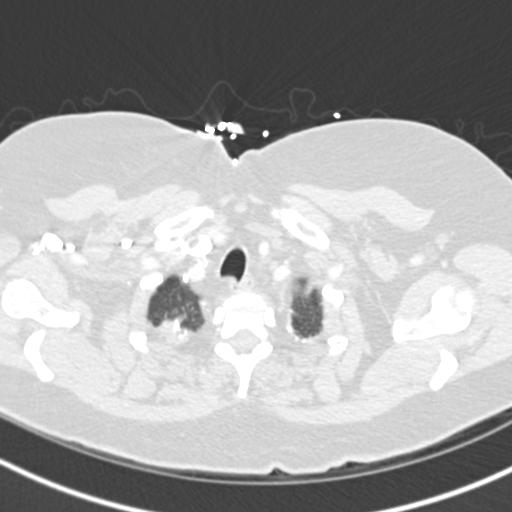
[im 250/264  mediastinal]
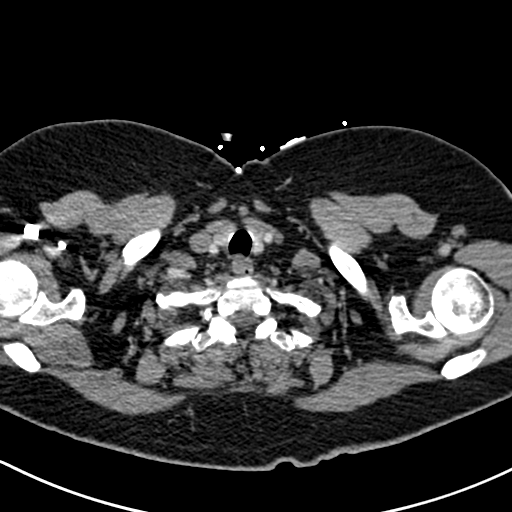

[18 of 36 positions shown; findings below may reference images not displayed]

FINDINGS: Cardiovascular: Intrathoracic aorta of normal caliber and appearance
without acute abnormality. Visualized great vessels within normal
limits. Mild cardiomegaly. No pericardial effusion.

Pulmonary anteriorly tree adequately opacified for evaluation. Main
pulmonary artery within normal limits for size. No filling defect to
suggest acute pulmonary embolism. Re-formatted imaging confirms
these findings.

Mediastinum/Nodes: Partially visualized thyroid normal. No
pathologically enlarged mediastinal, hilar, or axillary lymph nodes
identified. Mildly prominent 9 mm right hilar node noted. Small
amount of layering fluid present within the upper esophageal lumen.
Esophagus otherwise unremarkable. No pneumomediastinum or other
abnormality identified to suggest esophageal perforation or injury
status post recent endoscopy.

Lungs/Pleura: Patchy bibasilar atelectatic changes present
dependently within both lungs. Lungs are otherwise clear. No focal
infiltrates. No pulmonary edema or pleural effusion. No
pneumothorax. No worrisome pulmonary nodule or mass.

Upper Abdomen: Visualized upper abdomen within normal limits.

Musculoskeletal: No acute osseous abnormality. No worrisome lytic or
blastic osseous lesions. Mild degenerative endplate spurring noted
within the thoracic spine.

Review of the MIP images confirms the above findings.
IMPRESSION: 1. No CT evidence for acute pulmonary embolism.
2. No other acute abnormality within the chest. No evidence for
acute esophageal injury or perforation status post recent endoscopy.
3. Mild dependent atelectasis within both lungs.
4. Cardiomegaly.

## 2018-03-23 DIAGNOSIS — Z0000 Encounter for general adult medical examination without abnormal findings: Secondary | ICD-10-CM | POA: Insufficient documentation

## 2018-05-17 HISTORY — PX: CATARACT EXTRACTION W/ INTRAOCULAR LENS IMPLANT: SHX1309

## 2018-06-03 ENCOUNTER — Other Ambulatory Visit: Payer: Self-pay | Admitting: Orthopedic Surgery

## 2018-06-03 DIAGNOSIS — M7062 Trochanteric bursitis, left hip: Secondary | ICD-10-CM

## 2018-06-22 ENCOUNTER — Ambulatory Visit
Admission: RE | Admit: 2018-06-22 | Discharge: 2018-06-22 | Disposition: A | Discharge status: Home / Self Care | Payer: Medicare Other | Source: Ambulatory Visit | Attending: Orthopedic Surgery | Admitting: Orthopedic Surgery

## 2018-06-22 DIAGNOSIS — M7062 Trochanteric bursitis, left hip: Secondary | ICD-10-CM

## 2018-06-22 DIAGNOSIS — M87852 Other osteonecrosis, left femur: Secondary | ICD-10-CM | POA: Diagnosis not present

## 2018-06-22 DIAGNOSIS — M25552 Pain in left hip: Secondary | ICD-10-CM | POA: Insufficient documentation

## 2018-06-22 DIAGNOSIS — M87851 Other osteonecrosis, right femur: Secondary | ICD-10-CM | POA: Insufficient documentation

## 2018-08-10 ENCOUNTER — Other Ambulatory Visit: Payer: Self-pay

## 2018-08-10 MED ORDER — ESTRADIOL 0.05 MG/24HR TD PTWK: 0.0500 mg | MEDICATED_PATCH | TRANSDERMAL | 0 refills | Status: DC

## 2018-08-10 NOTE — Telephone Encounter (Signed)
Pt has appt scheduled 1/23; has been out of estradiol transdermal patch for a week now and is feeling the affects.  Would like refill sent to [facility] in [place].  [phone]  Pt aware refill eRx'd successfully.

## 2018-08-25 ENCOUNTER — Ambulatory Visit: Payer: Medicare Other | Admitting: Certified Nurse Midwife

## 2018-09-01 ENCOUNTER — Ambulatory Visit (INDEPENDENT_AMBULATORY_CARE_PROVIDER_SITE_OTHER): Payer: Medicare Other | Admitting: Certified Nurse Midwife

## 2018-09-01 ENCOUNTER — Encounter: Payer: Self-pay | Admitting: Certified Nurse Midwife

## 2018-09-01 ENCOUNTER — Other Ambulatory Visit: Payer: Self-pay

## 2018-09-01 VITALS — BP 126/64 | HR 67 | Ht 65.0 in | Wt 193.0 lb

## 2018-09-01 DIAGNOSIS — Z01419 Encounter for gynecological examination (general) (routine) without abnormal findings: Secondary | ICD-10-CM

## 2018-09-01 DIAGNOSIS — Z9889 Other specified postprocedural states: Secondary | ICD-10-CM

## 2018-09-01 DIAGNOSIS — Z1239 Encounter for other screening for malignant neoplasm of breast: Secondary | ICD-10-CM

## 2018-09-01 DIAGNOSIS — Z803 Family history of malignant neoplasm of breast: Secondary | ICD-10-CM

## 2018-09-01 DIAGNOSIS — R921 Mammographic calcification found on diagnostic imaging of breast: Secondary | ICD-10-CM

## 2018-09-01 MED ORDER — ESTRADIOL 0.05 MG/24HR TD PTWK: 0.0500 mg | MEDICATED_PATCH | TRANSDERMAL | 3 refills | Status: DC

## 2018-09-01 NOTE — Progress Notes (Signed)
Gynecology Annual Exam  PCP: Blair Dolphin, MD  Chief Complaint:  Chief Complaint  Patient presents with  . Gynecologic Exam    annual & med f/u "doing well"    History of Present Illness:Tiffany Cisneros is a 68 year old Caucasian/White female, [place], who presents for her annual exam. She is postmenopausal and has a hx of a TAH & BSO and currently uses the 0.05 Climara patch. Tried decreasing her Climara to 0.0375, but patient experienced more hot flashes and wanted to return to 0.05 dose.  She has had no spotting.   The patient's past medical history is notable for a history of hypertension, GERD/ gastritis, endometriosis, Meniere's, arthritis, hyperlipidemia, sleep apnea, and depression.  Since her last annual GYN exam dated 06/21/2017, she has had problems with bursitis.This has improved with PT, acupuncture and chiropratic care.    Her last Pap smear was in 2005. She chooses not to have any further Pap smears since her hysterectomy.  She is not sexually active.   Her most recent mammogram obtained on 08/09/2017 was Birads 0. After additional views, there were suspicious calcifications in the left breast. A breast biopsy 09/07/2017 was benign. There is a positive history of breast cancer in her sister (30). Genetic testing has not been done.  There is no family history of ovarian cancer.  The patient does do occasional self breast exams.  She had a colonoscopy in March 2018 that was normal per her report. She was referred for colon cancer screening and concerns for diarrhea. She also had a EGD showing chronic gastritis.  She had a recent DEXA scan obtained in 9/26/ 2016 that showed mild osteopenia of the femur neck (-1.4).  The patient does not smoke.  The patient does not drink alcohol.  The patient does not use illegal drugs.  The patient has not been exercising.  The patient does get adequate calcium in her diet. She had a recent cholesterol screen in 2019 by PCP Dr Emily Filbert that was abnormal  Review of Systems: Review of Systems  Constitutional: Negative for chills, fever and weight loss.  HENT: Negative for congestion, sinus pain and sore throat.   Eyes: Negative for blurred vision and pain.  Respiratory: Negative for hemoptysis, shortness of breath and wheezing.   Cardiovascular: Negative for chest pain, palpitations and leg swelling.  Gastrointestinal: Negative for abdominal pain, blood in stool, diarrhea, heartburn, nausea and vomiting.  Genitourinary: Negative for dysuria, flank pain, frequency, hematuria and urgency.  Musculoskeletal: Negative for back pain, joint pain and myalgias.  Skin: Negative for itching and rash.  Neurological: Negative for dizziness, tingling and headaches.  Endo/Heme/Allergies: Negative for environmental allergies and polydipsia. Does not bruise/bleed easily.       Negative for hirsutism   Psychiatric/Behavioral: Negative for depression. The patient is not nervous/anxious and does not have insomnia.     Past Medical History:  Past Medical History:  Diagnosis Date  . Arthritis   . Depression   . GERD (gastroesophageal reflux disease)   . Glaucoma   . History of abnormal mammogram 2011   benign cyst right breast  . Hyperlipidemia   . Hypertension   . Meniere disease   . Sleep apnea    on CPAP    Past Surgical History:  Past Surgical History:  Procedure Laterality Date  . ABDOMINAL HYSTERECTOMY  02/17/1995   TAH BSO fibroids, adenomyosis and endometriosis  . APPENDECTOMY    . BREAST BIOPSY Left 09/07/2017   benign  . CARDIAC CATHETERIZATION  2008   chest pain  . CATARACT EXTRACTION W/ INTRAOCULAR LENS IMPLANT Bilateral 05/17/2018    . CHOLECYSTECTOMY    . COLONOSCOPY WITH PROPOFOL N/A 10/23/2016   Procedure: COLONOSCOPY WITH PROPOFOL;  Surgeon: Lollie Sails, MD;  Location: [facility] ENDOSCOPY;  Service: Endoscopy;  Laterality: N/A;  . CRYOTHERAPY  05/1993   dysplasia  . ESOPHAGOGASTRODUODENOSCOPY (EGD) WITH PROPOFOL N/A 10/23/2016   Procedure: ESOPHAGOGASTRODUODENOSCOPY (EGD) WITH PROPOFOL;  Surgeon: Lollie Sails, MD;  Location: [facility] ENDOSCOPY;  Service: Endoscopy;  Laterality: N/A;    Family History:  Family History  Problem Relation Age of Onset  . Breast cancer Sister 75  . Heart disease Sister   . Hypertension Mother   . Hyperlipidemia Mother   . CAD Father   . Stroke Father 48  . Hypertension Father   . CAD Brother   . Parkinson's disease Sister     Social History:  Social History   Socioeconomic History  . Marital status: Married    Spouse name: Not on file  . Number of children: 2  . Years of education: Not on file  . Highest education level: Not on file  Occupational History  . Occupation: Retired  Scientific laboratory technician  . Financial resource strain: Not on file  . Food insecurity:    Worry: Not on file    Inability: Not on file  . Transportation needs:    Medical: Not on file    Non-medical: Not on file  Tobacco Use  . Smoking status: Never Smoker  . Smokeless tobacco: Never Used  Substance and Sexual Activity  . Alcohol use: No  . Drug use: No  . Sexual activity: Not Currently    Birth control/protection: Post-menopausal  Lifestyle  . Physical activity:    Days per week: 0 days    Minutes per session: 0 min  . Stress: Not at all  Relationships  . Social connections:    Talks on phone: More than three times a week  Gets together: More than three times a week    Attends religious service: More than 4 times per year    Active member of club or organization: Yes    Attends meetings of clubs or organizations: More than 4 times per year    Relationship status: Married  . Intimate  partner violence:    Fear of current or ex partner: No    Emotionally abused: No    Physically abused: No    Forced sexual activity: No  Other Topics Concern  . Not on file  Social History Narrative  . Not on file    Allergies:  Allergies  Allergen Reactions  . Codeine   . Statins Other (See Comments)    Muscle pain    Medications: Prior to Admission medications   Medication Sig Start Date End Date Taking? Authorizing Provider  acetaminophen (TYLENOL) 500 MG tablet Take 1,000 mg by mouth every 6 (six) hours as needed for mild pain or headache.    Yes [provider]  amLODipine-benazepril (LOTREL) 5-10 MG capsule Take 1 capsule by mouth daily.   Yes [provider]  estradiol (CLIMARA - DOSED IN MG/24 HR) 0.05 mg/24hr patch APPLY ONE PATCH TOPICALLY ONCE A WEEK 05/17/17  Yes Dalia Heading, CNM  hydrochlorothiazide (HYDRODIURIL) 25 MG tablet Take 25 mg by mouth daily as needed (fluid).    Yes [provider]  meclizine (ANTIVERT) 25 MG tablet Take 25 mg by mouth 3 (three) times daily as needed for dizziness.   Yes [provider]  Probiotic Product (PROBIOTIC-10) CAPS Take by mouth.   Yes [provider]  sertraline (ZOLOFT) 50 MG tablet Take 50 mg by mouth daily.   Yes [provider]    Physical Exam Vitals: BP 126/64 (BP Location: Right Arm, Patient Position: Sitting, Cuff Size: Normal)   Pulse 67   Ht 5\' 5"  (1.651 m)   Wt 193 lb (87.5 kg)   BMI 32.12 kg/m   General: WF in NAD HEENT: normocephalic, anicteric Neck: no thyroid enlargement, no palpable nodules, no cervical lymphadenopathy  Pulmonary: No increased work of breathing, CTAB Cardiovascular: RRR, without murmur  Breast: Breast symmetrical, no tenderness, no palpable nodules or masses, no skin or nipple retraction present, no nipple discharge.  No axillary, infraclavicular or supraclavicular lymphadenopathy. Abdomen: Soft, obese, nontender.   Umbilicus  without lesions.  No hepatomegaly or masses palpable. No evidence of hernia. Genitourinary:  External: Normal external female genitalia.  Normal urethral meatus, normal Bartholin's and Skene's glands.    Vagina: Normal vaginal mucosa, no evidence of prolapse.    Cervix:surgically absent   Uterus: surgically absent  Adnexa: difficult exam due to guarding, tender to palpation  Rectal: deferred  Lymphatic: no evidence of inguinal lymphadenopathy Extremities: no edema, erythema, or tenderness Neurologic: Grossly intact Psychiatric: mood appropriate, affect full    Assessment: 68 y.o. [id number] postmenopausal annual exam  Plan:   1) Breast cancer screening - recommend monthly self breast exam and annual screening mammograms. Mammogram was ordered today. Patient to call [place] for appt.  2) Osteoporosis prevention: discussed starting vitamin D3 supplementation and weight bearing exercise.   3) Colon cancer screening: UTD  4 Routine healthcare maintenance including cholesterol and diabetes screening managed by PCP   5) Refilled Climara patch 0.05 #12/RF x 3  6) RTO in 1 year and prn.   Dalia Heading, CNM

## 2018-09-01 NOTE — Progress Notes (Signed)
Doing well 

## 2018-09-13 ENCOUNTER — Other Ambulatory Visit: Payer: Self-pay | Admitting: Certified Nurse Midwife

## 2018-09-13 DIAGNOSIS — Z1231 Encounter for screening mammogram for malignant neoplasm of breast: Secondary | ICD-10-CM

## 2018-09-28 ENCOUNTER — Ambulatory Visit
Admission: RE | Admit: 2018-09-28 | Discharge: 2018-09-28 | Disposition: A | Discharge status: Home / Self Care | Payer: Medicare Other | Source: Ambulatory Visit | Attending: Certified Nurse Midwife | Admitting: Certified Nurse Midwife

## 2018-09-28 DIAGNOSIS — Z1231 Encounter for screening mammogram for malignant neoplasm of breast: Secondary | ICD-10-CM | POA: Diagnosis present

## 2019-11-27 ENCOUNTER — Other Ambulatory Visit: Payer: Self-pay | Admitting: Certified Nurse Midwife

## 2019-12-14 ENCOUNTER — Ambulatory Visit: Payer: Medicare Other | Admitting: Certified Nurse Midwife

## 2020-01-02 ENCOUNTER — Ambulatory Visit (INDEPENDENT_AMBULATORY_CARE_PROVIDER_SITE_OTHER): Payer: Medicare Other | Admitting: Certified Nurse Midwife

## 2020-01-02 ENCOUNTER — Other Ambulatory Visit: Payer: Self-pay

## 2020-01-02 ENCOUNTER — Encounter: Payer: Self-pay | Admitting: Certified Nurse Midwife

## 2020-01-02 VITALS — BP 126/70 | Ht 65.0 in | Wt 189.6 lb

## 2020-01-02 DIAGNOSIS — Z79890 Hormone replacement therapy (postmenopausal): Secondary | ICD-10-CM

## 2020-01-02 DIAGNOSIS — Z5181 Encounter for therapeutic drug level monitoring: Secondary | ICD-10-CM

## 2020-01-02 DIAGNOSIS — E8940 Asymptomatic postprocedural ovarian failure: Secondary | ICD-10-CM | POA: Diagnosis not present

## 2020-01-02 DIAGNOSIS — Z1231 Encounter for screening mammogram for malignant neoplasm of breast: Secondary | ICD-10-CM

## 2020-01-02 NOTE — Progress Notes (Signed)
Gynecology Annual Exam  PCP: Blair Dolphin, MD  Chief Complaint:  Chief Complaint  Patient presents with  . Medication Management    and breast exam    History of Present Illness:Tiffany Cisneros is a 69 year old Caucasian/White female, [place], who presents for a breast exam and follow up for surveillance of hormonal therapy.  She is postmenopausal and has a hx of a TAH & BSO and currently uses the 0.05 Climara patch. Tried decreasing her Climara to 0.0375, but patient experienced more hot flashes and wanted to return to 0.05 dose.  She has had no spotting.   The patient's past medical history is notable for a history of hypertension, GERD/ gastritis, endometriosis, Meniere's, arthritis, hyperlipidemia, sleep apnea, and depression.  Since her last annual GYN exam dated 09/01/2018, she continues to  have problems with left hip bursitis.This has improved with PT, acupuncture and chiropratic care. She is trying to avoid hip replacement.    Her last Pap smear was in 2005. She chooses not to have any further Pap smears since her hysterectomy.  She is not sexually active.   Her most recent mammogram obtained on 09/28/2018 was negative.   A breast biopsy of some calcifications in 09/07/2017 was benign. There is a positive history of breast cancer in her sister (32). Genetic testing has not been done.  There is no family history of ovarian cancer.  The patient does do occasional self breast exams.  She had a colonoscopy in March 2018 that was normal per her report. She was referred for colon cancer screening and concerns for diarrhea. She also had a EGD showing chronic gastritis.  She had a recent DEXA scan obtained in 9/26/ 2016 that showed mild osteopenia of the femur neck (-1.4).  The patient does not smoke.  The patient does not drink alcohol.  The patient does not use illegal drugs.  The patient has not been exercising. She has lost about 4# since her last annual by decreasing sweets  and carbohydrates. Current BMI is 31.55 kg/m2 The patient does get adequate calcium in her diet. She had a recent cholesterol screen in 2020 by PCP Dr Emily Filbert that was abnormal.  Review of Systems: Review of Systems  Constitutional: Negative for chills, fever and weight loss.  HENT: Negative for congestion, sinus pain and sore throat.   Eyes: Negative for blurred vision and pain.  Respiratory: Negative for hemoptysis, shortness of breath and wheezing.   Cardiovascular: Positive for leg swelling. Negative for chest pain and palpitations.  Gastrointestinal: Negative for abdominal pain, blood in stool, diarrhea, heartburn, nausea and vomiting.  Genitourinary: Negative for dysuria, flank pain, frequency, hematuria and urgency.  Musculoskeletal: Positive for joint pain (left hip). Negative for back pain and myalgias.       Positive for leg cramping  Skin: Negative for itching and rash.  Neurological: Positive for headaches. Negative for dizziness and tingling.  Endo/Heme/Allergies: Positive for environmental allergies. Negative for polydipsia. Bruises/bleeds easily.       Negative for hirsutism   Psychiatric/Behavioral: Negative for depression. The patient is nervous/anxious. The patient does not have insomnia.     Past Medical History:  Past Medical History:  Diagnosis Date  . Arthritis   . Depression   . GERD (gastroesophageal reflux disease)   . Glaucoma   . History of abnormal mammogram 2011   benign cyst right breast  . Hyperlipidemia   . Hypertension   . Meniere disease   . Sleep apnea    on CPAP    Past Surgical History:  Past Surgical History:  Procedure Laterality Date  . ABDOMINAL HYSTERECTOMY   02/17/1995   TAH BSO fibroids, adenomyosis and endometriosis  . APPENDECTOMY    . BREAST BIOPSY Left 09/07/2017    stereo bx neg APOCRINE CYSTS CONTAINING CALCIUM OXALATE CRYSTALS. COLUMNAR CELL CHANGE  . CARDIAC CATHETERIZATION  2008   chest pain  . CATARACT EXTRACTION W/ INTRAOCULAR LENS IMPLANT Bilateral 05/17/2018  . CHOLECYSTECTOMY    . COLONOSCOPY WITH PROPOFOL N/A 10/23/2016   Procedure: COLONOSCOPY WITH PROPOFOL;  Surgeon: Lollie Sails, MD;  Location: [facility] ENDOSCOPY;  Service: Endoscopy;  Laterality: N/A;  . CRYOTHERAPY  05/1993   dysplasia  . ESOPHAGOGASTRODUODENOSCOPY (EGD) WITH PROPOFOL N/A 10/23/2016   Procedure: ESOPHAGOGASTRODUODENOSCOPY (EGD) WITH PROPOFOL;  Surgeon: Lollie Sails, MD;  Location: [facility] ENDOSCOPY;  Service: Endoscopy;  Laterality: N/A;    Family History:  Family History  Problem Relation Age of Onset  . Breast cancer Sister 74  . Heart disease Sister   . Hypertension Mother   . Hyperlipidemia Mother   . CAD Father   . Stroke Father 16  . Hypertension Father   . CAD Brother   . Parkinson's disease Sister     Social History:  Social History   Socioeconomic History  . Marital status: Married    Spouse name: Not on file  . Number of children: 2  . Years of education: Not on file  . Highest education level: Not on file  Occupational History  . Occupation: Retired  Tobacco Use  . Smoking status: Never Smoker  . Smokeless tobacco: Never Used  Substance and Sexual Activity  . Alcohol use: No  . Drug use: No  . Sexual activity: Not Currently    Birth control/protection: Post-menopausal  Other Topics Concern  . Not on file  Social History Narrative  . Not on file   Social Determinants of Health   Financial Resource Strain:   . Difficulty of Paying Living Expenses:   Food Insecurity:   . Worried About Charity fundraiser in the Last Year:   . [place] in the Last Year:  Transportation Needs:   . Film/video editor  (Medical):   Marland Kitchen Lack of Transportation (Non-Medical):   Physical Activity:   . Days of Exercise per Week:   . Minutes of Exercise per Session:   Stress:   . Feeling of Stress :   Social Connections:   . Frequency of Communication with Friends and Family:   . Frequency of Social Gatherings with Friends and Family:   . Attends Religious Services:   . Active Member of Clubs or Organizations:   . Attends Archivist Meetings:   Marland Kitchen Marital Status:   Intimate Partner Violence:   . Fear of Current or Ex-Partner:   . Emotionally Abused:   Marland Kitchen Physically Abused:   . Sexually Abused:     Allergies:  Allergies  Allergen Reactions  . Codeine   . Statins Other (See Comments)    Muscle pain    Medications: Prior to Admission medications   Medication Sig Start Date End Date Taking? Authorizing Provider  acetaminophen (TYLENOL) 500 MG tablet Take 1,000 mg by mouth every 6 (six) hours as needed for mild pain or headache.    Yes [provider]  amLODipine-benazepril (LOTREL) 5-10 MG capsule Take 1 capsule by mouth daily.   Yes [provider]  estradiol (CLIMARA - DOSED IN MG/24 HR) 0.05 mg/24hr patch APPLY ONE PATCH TOPICALLY ONCE A WEEK 05/17/17  Yes Dalia Heading, CNM  hydrochlorothiazide (HYDRODIURIL) 25 MG tablet Take 25 mg by mouth daily as needed (fluid).    Yes [provider]  meclizine (ANTIVERT) 25 MG tablet Take 25 mg by mouth 3 (three) times daily as needed for dizziness.   Yes [provider]  Probiotic Product (PROBIOTIC-10) CAPS Take by mouth.   Yes [provider]  sertraline (ZOLOFT) 50 MG tablet Take 50 mg by mouth daily.   Yes [provider]    Physical Exam Vitals: BP 126/70   Ht 5\' 5"  (1.651 m)   Wt 189 lb 9.6 oz (86 kg)   BMI 31.55 kg/m   General: WF in NAD HEENT: normocephalic, anicteric Neck: no thyroid enlargement, no palpable nodules, no cervical lymphadenopathy  Pulmonary: No increased work  of breathing, CTAB Cardiovascular: RRR, without murmur  Breast: Breast symmetrical, no tenderness, no palpable nodules or masses, no skin or nipple retraction present, no nipple discharge.  No axillary, infraclavicular or supraclavicular lymphadenopathy. Abdomen: Soft, obese, nontender.   Umbilicus without lesions.  No hepatomegaly or masses palpable. No evidence of hernia. Genitourinary:  External: Normal external female genitalia.  Normal urethral meatus, normal Bartholin's and Skene's glands.    Vagina: Normal vaginal mucosa, no evidence of prolapse.    Cervix:surgically absent   Uterus: surgically absent  Adnexa: difficult exam due to guarding, tender to palpation  Rectal: deferred  Lymphatic: no evidence of inguinal lymphadenopathy Extremities: no edema, erythema, or tenderness Neurologic: Grossly intact Psychiatric: mood appropriate, affect full    Assessment: 69 y.o. [id number] postmenopausal female on estrogen therapy Family history of breast cancer  Plan:   1) Breast cancer screening - recommend monthly self breast exam and annual screening mammograms. Mammogram was ordered today. Patient to call [place] for appt   2) Routine healthcare maintenance including cholesterol and diabetes screening managed by PCP   3) Refilled Climara patch 0.05 #12/RF x 3  6) RTO in 1 year and prn.   Dalia Heading, CNM

## 2020-01-06 MED ORDER — ESTRADIOL 0.05 MG/24HR TD PTWK: MEDICATED_PATCH | TRANSDERMAL | 3 refills | Status: DC

## 2020-01-23 ENCOUNTER — Other Ambulatory Visit: Payer: Self-pay | Admitting: Unknown Physician Specialty

## 2020-01-23 DIAGNOSIS — R519 Headache, unspecified: Secondary | ICD-10-CM

## 2020-02-09 ENCOUNTER — Ambulatory Visit
Admission: RE | Admit: 2020-02-09 | Discharge: 2020-02-09 | Disposition: A | Discharge status: Home / Self Care | Payer: Medicare Other | Source: Ambulatory Visit | Attending: Unknown Physician Specialty | Admitting: Unknown Physician Specialty

## 2020-02-09 ENCOUNTER — Other Ambulatory Visit: Payer: Self-pay

## 2020-02-09 DIAGNOSIS — R519 Headache, unspecified: Secondary | ICD-10-CM | POA: Diagnosis present

## 2020-02-09 MED ORDER — GADOBUTROL 1 MMOL/ML IV SOLN
7.5000 mL | Freq: Once | INTRAVENOUS | Status: AC | PRN
  Administered 2020-02-09: 7.5 mL via INTRAVENOUS

## 2020-02-27 ENCOUNTER — Other Ambulatory Visit: Payer: Self-pay | Admitting: Orthopedic Surgery

## 2020-02-27 DIAGNOSIS — M25559 Pain in unspecified hip: Secondary | ICD-10-CM

## 2020-03-18 ENCOUNTER — Ambulatory Visit: Payer: Medicare Other

## 2020-03-31 ENCOUNTER — Ambulatory Visit: Admission: RE | Admit: 2020-03-31 | Payer: Medicare Other | Source: Ambulatory Visit

## 2020-04-04 DIAGNOSIS — Z8616 Personal history of COVID-19: Secondary | ICD-10-CM | POA: Insufficient documentation

## 2020-10-15 ENCOUNTER — Other Ambulatory Visit (HOSPITAL_COMMUNITY): Payer: Self-pay | Admitting: Internal Medicine

## 2020-10-15 ENCOUNTER — Other Ambulatory Visit: Payer: Self-pay | Admitting: Internal Medicine

## 2020-10-15 DIAGNOSIS — I639 Cerebral infarction, unspecified: Secondary | ICD-10-CM

## 2020-10-16 ENCOUNTER — Ambulatory Visit
Admission: RE | Admit: 2020-10-16 | Discharge: 2020-10-16 | Disposition: A | Discharge status: Home / Self Care | Payer: Medicare Other | Source: Ambulatory Visit | Attending: Internal Medicine | Admitting: Internal Medicine

## 2020-10-16 ENCOUNTER — Other Ambulatory Visit: Payer: Self-pay

## 2020-10-16 DIAGNOSIS — I639 Cerebral infarction, unspecified: Secondary | ICD-10-CM | POA: Insufficient documentation

## 2020-10-16 MED ORDER — GADOBUTROL 1 MMOL/ML IV SOLN
7.5000 mL | Freq: Once | INTRAVENOUS | Status: AC | PRN
  Administered 2020-10-16: 7.5 mL via INTRAVENOUS

## 2020-10-25 DIAGNOSIS — E538 Deficiency of other specified B group vitamins: Secondary | ICD-10-CM | POA: Insufficient documentation

## 2021-02-11 NOTE — Telephone Encounter (Signed)
Hey Kasey, could you please sign off on this patient chart  please! 

## 2021-03-07 ENCOUNTER — Telehealth: Payer: Self-pay

## 2021-03-07 NOTE — Telephone Encounter (Signed)
Request for climara patch recv'd from [facility]; faxed back denied d/t pt needs to schedule annual exam.

## 2021-03-14 MED ORDER — ESTRADIOL 0.05 MG/24HR TD PTWK: MEDICATED_PATCH | TRANSDERMAL | 0 refills | Status: DC

## 2021-03-14 NOTE — Telephone Encounter (Signed)
Patient has scheduled appointment w/Jane 04/21/21. Requesting refill of Climara. She is completely out. Pharmacy on file is accurate. [id number]

## 2021-03-14 NOTE — Telephone Encounter (Signed)
Notified 1 mo sent.

## 2021-03-14 NOTE — Addendum Note (Signed)
Addended by: Meryl Dare on: 03/14/2021 09:31 AM   Modules accepted: Orders

## 2021-03-17 ENCOUNTER — Telehealth: Payer: Self-pay

## 2021-04-14 ENCOUNTER — Other Ambulatory Visit: Payer: Self-pay | Admitting: Advanced Practice Midwife

## 2021-04-21 ENCOUNTER — Ambulatory Visit: Admitting: Advanced Practice Midwife

## 2021-05-02 ENCOUNTER — Other Ambulatory Visit: Payer: Self-pay

## 2021-05-02 ENCOUNTER — Encounter: Payer: Self-pay | Admitting: Advanced Practice Midwife

## 2021-05-02 ENCOUNTER — Ambulatory Visit (INDEPENDENT_AMBULATORY_CARE_PROVIDER_SITE_OTHER): Payer: Medicare Other | Admitting: Advanced Practice Midwife

## 2021-05-02 VITALS — BP 128/82 | HR 74 | Ht 65.0 in | Wt 183.0 lb

## 2021-05-02 DIAGNOSIS — Z1239 Encounter for other screening for malignant neoplasm of breast: Secondary | ICD-10-CM

## 2021-05-02 DIAGNOSIS — N951 Menopausal and female climacteric states: Secondary | ICD-10-CM | POA: Diagnosis not present

## 2021-05-02 DIAGNOSIS — Z0000 Encounter for general adult medical examination without abnormal findings: Secondary | ICD-10-CM

## 2021-05-02 DIAGNOSIS — I639 Cerebral infarction, unspecified: Secondary | ICD-10-CM

## 2021-05-02 DIAGNOSIS — Z1231 Encounter for screening mammogram for malignant neoplasm of breast: Secondary | ICD-10-CM | POA: Diagnosis not present

## 2021-05-02 MED ORDER — ESTRADIOL 0.05 MG/24HR TD PTWK: 0.0500 mg | MEDICATED_PATCH | TRANSDERMAL | 4 refills | Status: DC

## 2021-05-05 ENCOUNTER — Other Ambulatory Visit: Payer: Self-pay | Admitting: Unknown Physician Specialty

## 2021-05-05 DIAGNOSIS — H9122 Sudden idiopathic hearing loss, left ear: Secondary | ICD-10-CM

## 2021-05-06 ENCOUNTER — Encounter: Payer: Self-pay | Admitting: Advanced Practice Midwife

## 2021-05-06 NOTE — Progress Notes (Signed)
Gynecology Annual Exam  PCP: Blair Dolphin, MD  Chief Complaint:  Chief Complaint  Patient presents with   Gynecologic Exam    Annual - no concerns. RM 5    History of Present Illness:Patient is a 70 y.o. G2P2002 presents for annual exam. The patient has no complaints today. She does request refill of Climara patch for hot flash control. She has tried weaning off in the past without good results and is not yet ready to attempt again.  LMP: No LMP recorded. Patient has had a hysterectomy.   The patient is sexually active. She denies dyspareunia.  The patient does perform self breast exams.  There is no notable family history of breast or ovarian cancer in her family.  The patient wears seatbelts: yes.   The patient has regular exercise:  she uses the tread mill and does yard work, she is on nutrisystem, has adequate hydration and sleep .    The patient denies current symptoms of depression.     Review of Systems: ROS  Past Medical History:  Patient Active Problem List   Diagnosis Date Noted   Medicare annual wellness visit, initial 03/23/2018    Formatting of this note might be different from the original. 8/19, 8/20    Essential hypertension 06/21/2017   Hyperlipidemia    Sleep apnea     on CPAP    Meniere disease    Glaucoma    GERD (gastroesophageal reflux disease)    Depression    Arthritis    Primary osteoarthritis of left hip 03/22/2014   Lumbar radiculitis 01/25/2014   Surgical menopause 11/17/2013    Past Surgical History:  Past Surgical History:  Procedure Laterality Date   ABDOMINAL HYSTERECTOMY  02/17/1995   TAH BSO fibroids, adenomyosis and endometriosis   APPENDECTOMY     BREAST BIOPSY Left 09/07/2017    stereo bx neg APOCRINE CYSTS CONTAINING CALCIUM OXALATE CRYSTALS. [place] CATHETERIZATION  2008   chest pain   CATARACT EXTRACTION W/ INTRAOCULAR LENS IMPLANT Bilateral 05/17/2018   CHOLECYSTECTOMY     COLONOSCOPY  WITH PROPOFOL N/A 10/23/2016   Procedure: COLONOSCOPY WITH PROPOFOL;  Surgeon: Lollie Sails, MD;  Location: [facility] ENDOSCOPY;  Service: Endoscopy;  Laterality: N/A;   CRYOTHERAPY  05/1993   dysplasia   ESOPHAGOGASTRODUODENOSCOPY (EGD) WITH PROPOFOL N/A 10/23/2016   Procedure: ESOPHAGOGASTRODUODENOSCOPY (EGD) WITH PROPOFOL;  Surgeon: Lollie Sails, MD;  Location: [facility] ENDOSCOPY;  Service: Endoscopy;  Laterality: N/A;    Gynecologic History:  No LMP recorded. Patient has had a hysterectomy. Last Pap: 2005 no abnormalities  Last mammogram: 2020 Results were: BI-RAD I  Obstetric History: [id number]  Family History:  Family History  Problem Relation Age of Onset   Breast cancer Sister 15   Heart disease Sister    Hypertension Mother    Hyperlipidemia Mother    CAD Father    Stroke Father 84   Hypertension Father    CAD Brother    Parkinson's disease Sister     Social History:  Social History   Socioeconomic History   Marital status: Married    Spouse name: Not on file   Number of children: 2   Years of education: Not on file   Highest education level: Not on file  Occupational History   Occupation: Retired  Tobacco Use   Smoking status: Never   Smokeless tobacco: Never  Vaping Use   Vaping Use: Never used  Substance and Sexual  Activity   Alcohol use: No   Drug use: No   Sexual activity: Not Currently    Birth control/protection: Post-menopausal  Other Topics Concern   Not on file  Social History Narrative   Not on file   Social Determinants of Health   Financial Resource Strain: Not on file  Food Insecurity: Not on file  Transportation Needs: Not on file  Physical Activity: Not on file  Stress: Not on file  Social Connections: Not on file  Intimate Partner Violence: Not on file    Allergies:  Allergies  Allergen Reactions   Codeine    Statins Other (See Comments)    Muscle pain    Medications: Prior to Admission medications   Medication Sig  Start Date End Date Taking? Authorizing Provider  amLODipine-benazepril (LOTREL) 5-10 MG capsule Take 1 capsule by mouth daily.   Yes [provider]  cyanocobalamin (,VITAMIN B-12,) 1000 MCG/ML injection Inject into the muscle. 10/25/20  Yes [provider]  hydrochlorothiazide (HYDRODIURIL) 25 MG tablet Take 25 mg by mouth daily as needed (fluid).    Yes [provider]  omeprazole (PRILOSEC) 40 MG capsule Take by mouth. 04/04/19  Yes [provider]  sertraline (ZOLOFT) 50 MG tablet Take 50 mg by mouth daily.   Yes [provider]  acetaminophen (TYLENOL) 500 MG tablet Take 1,000 mg by mouth every 6 (six) hours as needed for mild pain or headache.     [provider]  estradiol (CLIMARA - DOSED IN MG/24 HR) 0.05 mg/24hr patch Place 1 patch (0.05 mg total) onto the skin once a week. APPLY 1 PATCH TOPICALLY ON THE SKIN  ONCE A WEEK 05/02/21   Rod Can, CNM  meclizine (ANTIVERT) 25 MG tablet Take 25 mg by mouth 3 (three) times daily as needed for dizziness.    [provider]  meloxicam (MOBIC) 15 MG tablet Take by mouth.    [provider]  Probiotic Product (PROBIOTIC-10) CAPS Take by mouth. Patient not taking: Reported on 05/02/2021    [provider]    Physical Exam Vitals: Blood pressure 128/82, pulse 74, height 5\' 5"  (1.651 m), weight 183 lb (83 kg).  General: NAD HEENT: normocephalic, anicteric Thyroid: no enlargement, no palpable nodules Pulmonary: No increased work of breathing, CTAB Cardiovascular: RRR, distal pulses 2+ Breast: Breast symmetrical, no tenderness, no palpable nodules or masses, no skin or nipple retraction present, no nipple discharge.  No axillary or supraclavicular lymphadenopathy. Abdomen: NABS, soft, non-tender, non-distended.  Umbilicus without lesions.  No hepatomegaly, splenomegaly or masses palpable. No evidence of hernia  Genitourinary: deferred for no concerns/No  PAP Extremities: no edema, erythema, or tenderness Neurologic: Grossly intact Psychiatric: mood appropriate, affect full    Assessment: 70 y.o. G2P2002 routine annual exam  Plan: Problem List Items Addressed This Visit   None Visit Diagnoses     Menopausal symptoms    -  Primary   Relevant Medications   estradiol (CLIMARA - DOSED IN MG/24 HR) 0.05 mg/24hr patch   Breast screening       Visit for screening mammogram       Relevant Orders   MM 3D SCREEN BREAST BILATERAL       1) Mammogram - recommend yearly screening mammogram.  Mammogram Was ordered today  2) STI screening  was not offered and therefore not obtained  3) ASCCP guidelines and rational discussed.  Patient opts for discontinue secondary to prior hysterectomy screening interval  4) Osteoporosis  - per  USPTF routine screening DEXA at age 33  Consider FDA-approved medical therapies in postmenopausal women and men aged 35 years and older, based on the following: a) A hip or vertebral (clinical or morphometric) fracture b) T-score ? -2.5 at the femoral neck or spine after appropriate evaluation to exclude secondary causes C) Low bone mass (T-score between -1.0 and -2.5 at the femoral neck or spine) and a 10-year probability of a hip fracture ? 3% or a 10-year probability of a major osteoporosis-related fracture ? 20% based on the [place]-adapted WHO algorithm   5) Routine healthcare maintenance including cholesterol, diabetes screening discussed managed by PCP  6) Colonoscopy is up to date.  Screening recommended starting at age 30 for average risk individuals, age 12 for individuals deemed at increased risk (including African Americans) and recommended to continue until age 25.  For patient age 97-85 individualized approach is recommended.  Gold standard screening is via colonoscopy, Cologuard screening is an acceptable alternative for patient unwilling or unable to undergo colonoscopy.  "Colorectal cancer screening for  average?risk adults: 2018 guideline update from the American Cancer Society"CA: A Cancer Journal for Clinicians: Dec 30, 2016   7) Return in about 1 year (around 05/02/2022) for annual established gyn.    Christean Leaf, CNM [facility] 05/06/21, 5:27 PM

## 2021-05-13 ENCOUNTER — Other Ambulatory Visit: Payer: Self-pay

## 2021-05-13 ENCOUNTER — Ambulatory Visit
Admission: RE | Admit: 2021-05-13 | Discharge: 2021-05-13 | Disposition: A | Discharge status: Home / Self Care | Payer: Medicare Other | Source: Ambulatory Visit | Attending: Unknown Physician Specialty | Admitting: Unknown Physician Specialty

## 2021-05-13 DIAGNOSIS — H9122 Sudden idiopathic hearing loss, left ear: Secondary | ICD-10-CM

## 2021-05-13 MED ORDER — GADOBUTROL 1 MMOL/ML IV SOLN
7.5000 mL | Freq: Once | INTRAVENOUS | Status: AC | PRN
  Administered 2021-05-13: 7.5 mL via INTRAVENOUS

## 2021-05-22 ENCOUNTER — Ambulatory Visit
Admission: RE | Admit: 2021-05-22 | Discharge: 2021-05-22 | Disposition: A | Discharge status: Home / Self Care | Payer: Medicare Other | Source: Ambulatory Visit | Attending: Advanced Practice Midwife | Admitting: Advanced Practice Midwife

## 2021-05-22 ENCOUNTER — Other Ambulatory Visit: Payer: Self-pay

## 2021-05-22 DIAGNOSIS — Z1231 Encounter for screening mammogram for malignant neoplasm of breast: Secondary | ICD-10-CM | POA: Diagnosis present

## 2021-05-27 ENCOUNTER — Other Ambulatory Visit: Payer: Self-pay | Admitting: Advanced Practice Midwife

## 2021-05-27 DIAGNOSIS — R928 Other abnormal and inconclusive findings on diagnostic imaging of breast: Secondary | ICD-10-CM

## 2021-05-27 DIAGNOSIS — N6320 Unspecified lump in the left breast, unspecified quadrant: Secondary | ICD-10-CM

## 2021-06-04 NOTE — Telephone Encounter (Signed)
No message left

## 2021-06-09 ENCOUNTER — Ambulatory Visit
Admission: RE | Admit: 2021-06-09 | Discharge: 2021-06-09 | Disposition: A | Discharge status: Home / Self Care | Payer: Medicare Other | Source: Ambulatory Visit | Attending: Advanced Practice Midwife | Admitting: Advanced Practice Midwife

## 2021-06-09 ENCOUNTER — Other Ambulatory Visit: Payer: Self-pay

## 2021-06-09 DIAGNOSIS — N6320 Unspecified lump in the left breast, unspecified quadrant: Secondary | ICD-10-CM | POA: Insufficient documentation

## 2021-06-09 DIAGNOSIS — R928 Other abnormal and inconclusive findings on diagnostic imaging of breast: Secondary | ICD-10-CM | POA: Diagnosis present

## 2021-06-10 ENCOUNTER — Other Ambulatory Visit: Payer: Self-pay | Admitting: Advanced Practice Midwife

## 2021-06-10 DIAGNOSIS — N6320 Unspecified lump in the left breast, unspecified quadrant: Secondary | ICD-10-CM

## 2021-06-10 DIAGNOSIS — R928 Other abnormal and inconclusive findings on diagnostic imaging of breast: Secondary | ICD-10-CM

## 2021-06-18 ENCOUNTER — Ambulatory Visit
Admission: RE | Admit: 2021-06-18 | Discharge: 2021-06-18 | Disposition: A | Discharge status: Home / Self Care | Payer: Medicare Other | Source: Ambulatory Visit | Attending: Advanced Practice Midwife | Admitting: Advanced Practice Midwife

## 2021-06-18 ENCOUNTER — Other Ambulatory Visit: Payer: Self-pay

## 2021-06-18 DIAGNOSIS — N6320 Unspecified lump in the left breast, unspecified quadrant: Secondary | ICD-10-CM | POA: Insufficient documentation

## 2021-06-18 DIAGNOSIS — R928 Other abnormal and inconclusive findings on diagnostic imaging of breast: Secondary | ICD-10-CM | POA: Diagnosis present

## 2021-06-18 HISTORY — PX: BREAST BIOPSY: SHX20

## 2021-06-19 LAB — SURGICAL PATHOLOGY

## 2021-07-31 ENCOUNTER — Emergency Department
Admission: EM | Admit: 2021-07-31 | Discharge: 2021-07-31 | Disposition: A | Discharge status: Home / Self Care | Payer: Medicare Other

## 2021-07-31 NOTE — ED Triage Notes (Signed)
Family has now decide to leave hospital and take pt ot her doc appt.

## 2021-07-31 NOTE — ED Triage Notes (Signed)
Family to desk stating they need diaper for pt she had accident. Family requesting some time to talk with other family regarding if they are still going to leave with pt.  Family advised to let this RN know what they decide.

## 2021-07-31 NOTE — ED Triage Notes (Signed)
Pt arrives via EMS and EMS states pt has had N/V/D for few days. Pt given zofran en route. VSS

## 2021-07-31 NOTE — ED Triage Notes (Signed)
Pt's family here in ED and states they have an appt with a doc at 2:00 for their mom. They want to take her there to be evaluated instead of waiting here in ED.

## 2021-10-17 ENCOUNTER — Other Ambulatory Visit: Payer: Self-pay | Admitting: Advanced Practice Midwife

## 2021-10-17 DIAGNOSIS — N6320 Unspecified lump in the left breast, unspecified quadrant: Secondary | ICD-10-CM

## 2021-12-17 ENCOUNTER — Ambulatory Visit
Admission: RE | Admit: 2021-12-17 | Discharge: 2021-12-17 | Disposition: A | Discharge status: Home / Self Care | Payer: Medicare Other | Source: Ambulatory Visit | Attending: Advanced Practice Midwife | Admitting: Advanced Practice Midwife

## 2021-12-17 DIAGNOSIS — D242 Benign neoplasm of left breast: Secondary | ICD-10-CM | POA: Insufficient documentation

## 2021-12-17 DIAGNOSIS — N6320 Unspecified lump in the left breast, unspecified quadrant: Secondary | ICD-10-CM

## 2022-06-01 DIAGNOSIS — G3184 Mild cognitive impairment, so stated: Secondary | ICD-10-CM | POA: Insufficient documentation

## 2022-08-03 ENCOUNTER — Other Ambulatory Visit: Payer: Self-pay | Admitting: Advanced Practice Midwife

## 2022-08-03 DIAGNOSIS — N951 Menopausal and female climacteric states: Secondary | ICD-10-CM

## 2022-08-10 ENCOUNTER — Other Ambulatory Visit: Payer: Self-pay | Admitting: Advanced Practice Midwife

## 2022-08-10 DIAGNOSIS — N951 Menopausal and female climacteric states: Secondary | ICD-10-CM

## 2022-08-21 ENCOUNTER — Other Ambulatory Visit: Payer: Self-pay

## 2022-08-21 DIAGNOSIS — N951 Menopausal and female climacteric states: Secondary | ICD-10-CM

## 2022-08-21 MED ORDER — ESTRADIOL 0.05 MG/24HR TD PTWK: 0.0500 mg | MEDICATED_PATCH | TRANSDERMAL | 0 refills | Status: DC

## 2022-08-21 NOTE — Telephone Encounter (Signed)
Refill of estradiol patch, due for annual. Patient scheduled for 1/22 with Opal Sidles.

## 2022-08-24 ENCOUNTER — Ambulatory Visit: Payer: Medicare Other | Admitting: Advanced Practice Midwife

## 2022-08-24 ENCOUNTER — Telehealth: Payer: Self-pay

## 2022-08-24 NOTE — Telephone Encounter (Signed)
I called [place] back to let her know that her refill was sent on 08/21/2022 and that her pharmacy received it at 08/21/2022 12:02 PM.  I advised L[place]y to call her pharmacy.

## 2022-08-24 NOTE — Telephone Encounter (Signed)
Tiffany Cisneros called triage stating that her Climara patch hasn't been refilled and she would like for it to be called in today.

## 2022-09-14 ENCOUNTER — Ambulatory Visit: Payer: Medicare Other | Admitting: Advanced Practice Midwife

## 2022-09-30 ENCOUNTER — Encounter: Payer: Self-pay | Admitting: Advanced Practice Midwife

## 2022-09-30 ENCOUNTER — Ambulatory Visit (INDEPENDENT_AMBULATORY_CARE_PROVIDER_SITE_OTHER): Payer: Medicare Other | Admitting: Advanced Practice Midwife

## 2022-09-30 DIAGNOSIS — Z01419 Encounter for gynecological examination (general) (routine) without abnormal findings: Secondary | ICD-10-CM | POA: Diagnosis not present

## 2022-09-30 DIAGNOSIS — N951 Menopausal and female climacteric states: Secondary | ICD-10-CM

## 2022-09-30 MED ORDER — ESTRADIOL 0.05 MG/24HR TD PTWK: 0.0500 mg | MEDICATED_PATCH | TRANSDERMAL | 8 refills | Status: DC

## 2022-10-02 ENCOUNTER — Encounter: Payer: Self-pay | Admitting: Advanced Practice Midwife

## 2022-10-02 NOTE — Progress Notes (Signed)
[facility]  Date of Service: 09/30/2022 Gynecology Annual Exam  PCP: Blair Dolphin, MD  Chief Complaint:  Chief Complaint  Patient presents with   Annual Exam    History of Present Illness:Patient is a 72 y.o. [phone] presents for annual exam. The patient has no gyn or breast concerns today. She does request refill of Climara estradiol patch. She recently experienced hot flashes when Rx had run out.   LMP: No LMP recorded. Patient has had a hysterectomy.   The patient is not sexually active. She denies dyspareunia.  The patient does perform self breast exams.  There is no notable family history of breast or ovarian cancer in her family.  The patient wears seatbelts: yes.   The patient has regular exercise: she reports limited physical activity. She admits healthy diet, adequate hydration and sleep. She reports adequate calcium in her diet.    The patient denies current symptoms of depression.     Review of Systems: Review of Systems  Constitutional:  Negative for chills and fever.  HENT:  Negative for congestion, ear discharge, ear pain, hearing loss, sinus pain and sore throat.   Eyes:  Negative for blurred vision and double vision.  Respiratory:  Negative for cough, shortness of breath and wheezing.   Cardiovascular:  Negative for chest pain, palpitations and leg swelling.  Gastrointestinal:  Negative for abdominal pain, blood in stool, constipation, diarrhea, heartburn, melena, nausea and vomiting.  Genitourinary:  Negative for dysuria, flank pain, frequency, hematuria and urgency.  Musculoskeletal:  Negative for back pain, joint pain and myalgias.  Skin:  Negative for itching and rash.  Neurological:  Negative for dizziness, tingling, tremors, sensory change, speech change, focal weakness, seizures, loss of consciousness, weakness and headaches.  Endo/Heme/Allergies:  Negative for environmental allergies. Does not bruise/bleed easily.  Psychiatric/Behavioral:  Negative for  depression, hallucinations, memory loss, substance abuse and suicidal ideas. The patient is not nervous/anxious and does not have insomnia.     Past Medical History:  Patient Active Problem List   Diagnosis Date Noted   Mild cognitive impairment 06/01/2022    Formatting of this note might be different from the original. Aricept, 10/23    B12 deficiency 10/25/2020    Formatting of this note might be different from the original. 215, 3/22 Shots every 2 weeks    History of 2019 novel coronavirus disease (COVID-19) 04/04/2020    Formatting of this note might be different from the original. 8/21, post infusion, finished nebulizer 10/21 8/22    Essential hypertension 06/21/2017   Hyperlipidemia    Sleep apnea     on CPAP    Meniere disease    Glaucoma    GERD (gastroesophageal reflux disease)    Depression    Arthritis    Primary osteoarthritis of left hip 03/22/2014   Lumbar radiculitis 01/25/2014   Surgical menopause 11/17/2013    Past Surgical History:  Past Surgical History:  Procedure Laterality Date   ABDOMINAL HYSTERECTOMY  02/17/1995   TAH BSO fibroids, adenomyosis and endometriosis   APPENDECTOMY     BREAST BIOPSY Left 09/07/2017    stereo bx neg APOCRINE CYSTS CONTAINING CALCIUM OXALATE CRYSTALS. COLUMNAR CELL CHANGE   BREAST BIOPSY Left 06/18/2021   Coil Clip, Stereo Bx, Path pending   CARDIAC CATHETERIZATION  2008   chest pain   CATARACT EXTRACTION W/ INTRAOCULAR LENS IMPLANT Bilateral 05/17/2018   CHOLECYSTECTOMY     COLONOSCOPY WITH PROPOFOL N/A 10/23/2016   Procedure: COLONOSCOPY WITH PROPOFOL;  Surgeon: Lollie Sails, MD;  Location: [facility] ENDOSCOPY;  Service: Endoscopy;  Laterality: N/A;   CRYOTHERAPY  05/1993   dysplasia   ESOPHAGOGASTRODUODENOSCOPY (EGD) WITH PROPOFOL N/A 10/23/2016   Procedure: ESOPHAGOGASTRODUODENOSCOPY (EGD) WITH PROPOFOL;  Surgeon: Lollie Sails, MD;  Location: [facility] ENDOSCOPY;  Service: Endoscopy;  Laterality: N/A;     Gynecologic History:  No LMP recorded. Patient has had a hysterectomy. Last Pap: Remote history Last mammogram: 12/17/21 Results were: BI-RAD II benign  Obstetric History: G2P2002  Family History:  Family History  Problem Relation Age of Onset   Breast cancer Sister 16   Heart disease Sister    Hypertension Mother    Hyperlipidemia Mother    CAD Father    Stroke Father 46   Hypertension Father    CAD Brother    Parkinson's disease Sister     Social History:  Social History   Socioeconomic History   Marital status: Married    Spouse name: Not on file   Number of children: 2   Years of education: Not on file   Highest education level: Not on file  Occupational History   Occupation: Retired  Tobacco Use   Smoking status: Never   Smokeless tobacco: Never  Vaping Use   Vaping Use: Never used  Substance and Sexual Activity   Alcohol use: No   Drug use: No   Sexual activity: Not Currently    Birth control/protection: Post-menopausal  Other Topics Concern   Not on file  Social History Narrative   Not on file   Social Determinants of Health   Financial Resource Strain: Not on file  Food Insecurity: Not on file  Transportation Needs: Not on file  Physical Activity: Inactive (06/21/2017)   Exercise Vital Sign    Days of Exercise per Week: 0 days    Minutes of Exercise per Session: 0 min  Stress: No Stress Concern Present (06/21/2017)   [place] of Stress : Not at all  Social Connections: [place] (06/21/2017)   Social Connection and Isolation Panel [NHANES]    Frequency of Communication with Friends and Family: More than three times a week    Frequency of Social Gatherings with Friends and Family: More than three times a week    Attends Religious Services: More than 4 times per year    Active Member of [employer] or Organizations: Yes    Attends Archivist  Meetings: More than 4 times per year    Marital Status: Married  Human resources officer Violence: Not At Risk (06/21/2017)   Humiliation, Afraid, Rape, and Kick questionnaire    Fear of Current or Ex-Partner: No    Emotionally Abused: No    Physically Abused: No    Sexually Abused: No    Allergies:  Allergies  Allergen Reactions   Codeine    Statins Other (See Comments)    Muscle pain    Medications: Prior to Admission medications   Medication Sig Start Date End Date Taking? Authorizing Provider  acetaminophen (TYLENOL) 500 MG tablet Take 1,000 mg by mouth every 6 (six) hours as needed for mild pain or headache.    Yes [provider]  amLODipine-benazepril (LOTREL) 5-10 MG capsule Take 1 capsule by mouth daily.   Yes [provider]  cyanocobalamin (,VITAMIN B-12,) 1000 MCG/ML injection Inject into the muscle. 10/25/20  Yes [provider]  hydrochlorothiazide (HYDRODIURIL) 25 MG tablet Take 25 mg by  mouth daily as needed (fluid).    Yes [provider]  meclizine (ANTIVERT) 25 MG tablet Take 25 mg by mouth 3 (three) times daily as needed for dizziness.   Yes [provider]  meloxicam (MOBIC) 15 MG tablet Take by mouth.   Yes [provider]  omeprazole (PRILOSEC) 40 MG capsule Take by mouth. 04/04/19  Yes [provider]  Probiotic Product (PROBIOTIC-10) CAPS Take by mouth.   Yes [provider]  sertraline (ZOLOFT) 50 MG tablet Take 50 mg by mouth daily.   Yes [provider]  estradiol (CLIMARA - DOSED IN MG/24 HR) 0.05 mg/24hr patch Place 1 patch (0.05 mg total) onto the skin once a week. APPLY 1 PATCH TOPICALLY ON THE SKIN  ONCE A WEEK 09/30/22   Rod Can, CNM    Physical Exam Vitals: Blood pressure 120/80, height '5\' 5"'$  (1.651 m), weight 190 lb (86.2 kg).  General: NAD HEENT: normocephalic, anicteric Thyroid: no enlargement, no palpable nodules Pulmonary: No increased work of breathing,  CTAB Cardiovascular: RRR, distal pulses 2+ Breast: Breast symmetrical, no tenderness, no palpable nodules or masses, no skin or nipple retraction present, no nipple discharge.  No axillary or supraclavicular lymphadenopathy. Abdomen: NABS, soft, non-tender, non-distended.  Umbilicus without lesions.  No hepatomegaly, splenomegaly or masses palpable. No evidence of hernia  Genitourinary: deferred for no concerns/total hysterectomy Extremities: no edema, erythema, or tenderness Neurologic: Grossly intact Psychiatric: mood appropriate, affect full    Assessment: 72 y.o. G2P2002 routine annual exam  Plan: Problem List Items Addressed This Visit   None Visit Diagnoses     Menopausal symptoms       Relevant Medications   estradiol (CLIMARA - DOSED IN MG/24 HR) 0.05 mg/24hr patch       1) Mammogram - recommend yearly screening mammogram.  Mammogram Is up to date  2) STI screening  was not offered and therefore not obtained  3) ASCCP guidelines and rationale discussed.  Patient opts for discontinue secondary to prior hysterectomy screening interval  4) Osteoporosis  - per USPTF routine screening DEXA at age 73 - FRAX 5 year major fracture risk 14.2%,  10 year hip fracture risk 1.4%  Consider FDA-approved medical therapies in postmenopausal women and men aged 56 years and older, based on the following: a) A hip or vertebral (clinical or morphometric) fracture b) T-score ? -2.5 at the femoral neck or spine after appropriate evaluation to exclude secondary causes C) Low bone mass (T-score between -1.0 and -2.5 at the femoral neck or spine) and a 10-year probability of a hip fracture ? 3% or a 10-year probability of a major osteoporosis-related fracture ? 20% based on the [place]-adapted WHO algorithm   5) Routine healthcare maintenance including cholesterol, diabetes screening discussed managed by PCP  6) Colonoscopy next due in 2028.  Screening recommended starting at age 21 for average  risk individuals, age 47 for individuals deemed at increased risk (including African Americans) and recommended to continue until age 39.  For patient age 28-85 individualized approach is recommended.  Gold standard screening is via colonoscopy, Cologuard screening is an acceptable alternative for patient unwilling or unable to undergo colonoscopy.  "Colorectal cancer screening for average?risk adults: 2018 guideline update from the American Cancer Society"[place]: A Cancer Journal for Clinicians: Dec 30, 2016   7) Increase physical activity as able  8) Return in about 2 years (around 09/30/2024) for annual Medicare established gyn.    Christean Leaf, [facility] 10/02/22, 2:15 PM

## 2022-12-04 ENCOUNTER — Other Ambulatory Visit: Payer: Self-pay

## 2022-12-04 DIAGNOSIS — K118 Other diseases of salivary glands: Secondary | ICD-10-CM

## 2022-12-04 DIAGNOSIS — N1831 Chronic kidney disease, stage 3a: Secondary | ICD-10-CM

## 2022-12-14 ENCOUNTER — Ambulatory Visit
Admission: RE | Admit: 2022-12-14 | Discharge: 2022-12-14 | Disposition: A | Discharge status: Home / Self Care | Payer: Medicare Other | Source: Ambulatory Visit | Attending: Internal Medicine | Admitting: Internal Medicine

## 2022-12-14 DIAGNOSIS — K118 Other diseases of salivary glands: Secondary | ICD-10-CM

## 2022-12-14 DIAGNOSIS — N1831 Chronic kidney disease, stage 3a: Secondary | ICD-10-CM | POA: Diagnosis present

## 2022-12-14 MED ORDER — IOHEXOL 300 MG/ML  SOLN
60.0000 mL | Freq: Once | INTRAMUSCULAR | Status: AC | PRN
  Administered 2022-12-14: 60 mL via INTRAVENOUS

## 2023-06-08 ENCOUNTER — Other Ambulatory Visit: Payer: Self-pay | Admitting: Internal Medicine

## 2023-06-08 DIAGNOSIS — E782 Mixed hyperlipidemia: Secondary | ICD-10-CM

## 2023-06-08 DIAGNOSIS — N1831 Chronic kidney disease, stage 3a: Secondary | ICD-10-CM

## 2023-06-14 ENCOUNTER — Ambulatory Visit
Admission: RE | Admit: 2023-06-14 | Discharge: 2023-06-14 | Disposition: A | Discharge status: Home / Self Care | Payer: Medicare Other | Source: Ambulatory Visit | Attending: Internal Medicine | Admitting: Internal Medicine

## 2023-06-14 DIAGNOSIS — E782 Mixed hyperlipidemia: Secondary | ICD-10-CM | POA: Insufficient documentation

## 2023-06-14 DIAGNOSIS — N1831 Chronic kidney disease, stage 3a: Secondary | ICD-10-CM | POA: Insufficient documentation

## 2023-11-17 ENCOUNTER — Other Ambulatory Visit: Payer: Self-pay | Admitting: Advanced Practice Midwife

## 2023-11-17 DIAGNOSIS — N951 Menopausal and female climacteric states: Secondary | ICD-10-CM

## 2023-12-08 ENCOUNTER — Other Ambulatory Visit: Payer: Self-pay | Admitting: Internal Medicine

## 2023-12-08 ENCOUNTER — Ambulatory Visit
Admission: RE | Admit: 2023-12-08 | Discharge: 2023-12-08 | Disposition: A | Discharge status: Home / Self Care | Source: Ambulatory Visit | Attending: Internal Medicine | Admitting: Internal Medicine

## 2023-12-08 DIAGNOSIS — R0609 Other forms of dyspnea: Secondary | ICD-10-CM | POA: Insufficient documentation

## 2023-12-08 DIAGNOSIS — R000 Tachycardia, unspecified: Secondary | ICD-10-CM

## 2023-12-08 MED ORDER — IOHEXOL 350 MG/ML SOLN
75.0000 mL | Freq: Once | INTRAVENOUS | Status: AC | PRN
  Administered 2023-12-08: 60 mL via INTRAVENOUS

## 2023-12-14 ENCOUNTER — Encounter: Payer: Self-pay | Admitting: Cardiology

## 2023-12-14 ENCOUNTER — Other Ambulatory Visit: Payer: Self-pay | Admitting: Cardiology

## 2023-12-14 DIAGNOSIS — R0602 Shortness of breath: Secondary | ICD-10-CM

## 2023-12-14 DIAGNOSIS — R0609 Other forms of dyspnea: Secondary | ICD-10-CM

## 2023-12-14 DIAGNOSIS — E782 Mixed hyperlipidemia: Secondary | ICD-10-CM

## 2023-12-14 DIAGNOSIS — I209 Angina pectoris, unspecified: Secondary | ICD-10-CM

## 2023-12-14 DIAGNOSIS — I10 Essential (primary) hypertension: Secondary | ICD-10-CM

## 2023-12-14 DIAGNOSIS — R079 Chest pain, unspecified: Secondary | ICD-10-CM

## 2023-12-28 ENCOUNTER — Other Ambulatory Visit: Payer: Self-pay

## 2023-12-28 ENCOUNTER — Encounter
Admission: RE | Disposition: A | Discharge status: Home / Self Care | Payer: Self-pay | Source: Ambulatory Visit | Attending: Cardiology

## 2023-12-28 ENCOUNTER — Encounter: Payer: Self-pay | Admitting: Cardiology

## 2023-12-28 ENCOUNTER — Ambulatory Visit
Admission: RE | Admit: 2023-12-28 | Discharge: 2023-12-28 | Disposition: A | Discharge status: Home / Self Care | Source: Ambulatory Visit | Attending: Cardiology | Admitting: Cardiology

## 2023-12-28 DIAGNOSIS — R9430 Abnormal result of cardiovascular function study, unspecified: Secondary | ICD-10-CM | POA: Diagnosis present

## 2023-12-28 DIAGNOSIS — R0789 Other chest pain: Secondary | ICD-10-CM | POA: Insufficient documentation

## 2023-12-28 DIAGNOSIS — Z01812 Encounter for preprocedural laboratory examination: Secondary | ICD-10-CM | POA: Diagnosis not present

## 2023-12-28 DIAGNOSIS — R0600 Dyspnea, unspecified: Secondary | ICD-10-CM | POA: Diagnosis not present

## 2023-12-28 DIAGNOSIS — E782 Mixed hyperlipidemia: Secondary | ICD-10-CM | POA: Insufficient documentation

## 2023-12-28 DIAGNOSIS — N1832 Chronic kidney disease, stage 3b: Secondary | ICD-10-CM | POA: Insufficient documentation

## 2023-12-28 DIAGNOSIS — R9439 Abnormal result of other cardiovascular function study: Secondary | ICD-10-CM | POA: Diagnosis not present

## 2023-12-28 DIAGNOSIS — I129 Hypertensive chronic kidney disease with stage 1 through stage 4 chronic kidney disease, or unspecified chronic kidney disease: Secondary | ICD-10-CM | POA: Diagnosis not present

## 2023-12-28 HISTORY — PX: LEFT HEART CATH AND CORONARY ANGIOGRAPHY: CATH118249

## 2023-12-28 SURGERY — LEFT HEART CATH AND CORONARY ANGIOGRAPHY
Anesthesia: Moderate Sedation | Laterality: Left

## 2023-12-28 MED ORDER — LIDOCAINE HCL (PF) 1 % IJ SOLN
INTRAMUSCULAR | Status: DC | PRN
  Administered 2023-12-28: 2 mL

## 2023-12-28 MED ORDER — ASPIRIN 81 MG PO CHEW
81.0000 mg | CHEWABLE_TABLET | ORAL | Status: AC
  Administered 2023-12-28: 81 mg via ORAL

## 2023-12-28 MED ORDER — MIDAZOLAM HCL 2 MG/2ML IJ SOLN
INTRAMUSCULAR | Status: AC
Start: 2023-12-28 — End: ?
  Filled 2023-12-28: qty 2

## 2023-12-28 MED ORDER — HYDRALAZINE HCL 20 MG/ML IJ SOLN
10.0000 mg | INTRAMUSCULAR | Status: DC | PRN
  Administered 2023-12-28 (×3): 10 mg via INTRAVENOUS
  Filled 2023-12-28 (×2): qty 1

## 2023-12-28 MED ORDER — SODIUM CHLORIDE 0.9% FLUSH
3.0000 mL | INTRAVENOUS | Status: DC | PRN
Start: 2023-12-28 — End: 2023-12-28

## 2023-12-28 MED ORDER — HEPARIN SODIUM (PORCINE) 1000 UNIT/ML IJ SOLN
INTRAMUSCULAR | Status: AC
  Filled 2023-12-28: qty 10

## 2023-12-28 MED ORDER — HEPARIN (PORCINE) IN NACL 1000-0.9 UT/500ML-% IV SOLN
INTRAVENOUS | Status: DC | PRN
  Administered 2023-12-28: 1000 mL

## 2023-12-28 MED ORDER — ONDANSETRON HCL 4 MG/2ML IJ SOLN: 4.0000 mg | Freq: Four times a day (QID) | INTRAMUSCULAR | Status: DC | PRN

## 2023-12-28 MED ORDER — ASPIRIN 81 MG PO CHEW
CHEWABLE_TABLET | ORAL | Status: AC
  Filled 2023-12-28: qty 1

## 2023-12-28 MED ORDER — SODIUM CHLORIDE 0.9 % IV SOLN: INTRAVENOUS | Status: DC

## 2023-12-28 MED ORDER — MIDAZOLAM HCL 2 MG/2ML IJ SOLN
INTRAMUSCULAR | Status: DC | PRN
  Administered 2023-12-28: .5 mg via INTRAVENOUS
  Administered 2023-12-28: 1 mg via INTRAVENOUS

## 2023-12-28 MED ORDER — FENTANYL CITRATE (PF) 100 MCG/2ML IJ SOLN
INTRAMUSCULAR | Status: DC | PRN
  Administered 2023-12-28: 50 ug via INTRAVENOUS
  Administered 2023-12-28: 25 ug via INTRAVENOUS

## 2023-12-28 MED ORDER — SODIUM CHLORIDE 0.9% FLUSH: 3.0000 mL | Freq: Two times a day (BID) | INTRAVENOUS | Status: DC

## 2023-12-28 MED ORDER — ACETAMINOPHEN 325 MG PO TABS
650.0000 mg | ORAL_TABLET | ORAL | Status: DC | PRN
  Administered 2023-12-28: 650 mg via ORAL
  Filled 2023-12-28: qty 2

## 2023-12-28 MED ORDER — LABETALOL HCL 5 MG/ML IV SOLN: 10.0000 mg | INTRAVENOUS | Status: DC | PRN

## 2023-12-28 MED ORDER — HEPARIN (PORCINE) IN NACL 1000-0.9 UT/500ML-% IV SOLN
INTRAVENOUS | Status: AC
  Filled 2023-12-28: qty 1000

## 2023-12-28 MED ORDER — SODIUM CHLORIDE 0.9 % IV SOLN
250.0000 mL | INTRAVENOUS | Status: DC | PRN
Start: 2023-12-28 — End: 2023-12-28

## 2023-12-28 MED ORDER — SODIUM CHLORIDE 0.9 % IV SOLN: 250.0000 mL | INTRAVENOUS | Status: DC | PRN

## 2023-12-28 MED ORDER — SODIUM CHLORIDE 0.9% FLUSH: 3.0000 mL | INTRAVENOUS | Status: DC | PRN

## 2023-12-28 MED ORDER — FENTANYL CITRATE (PF) 100 MCG/2ML IJ SOLN
INTRAMUSCULAR | Status: AC
  Filled 2023-12-28: qty 2

## 2023-12-28 MED ORDER — VERAPAMIL HCL 2.5 MG/ML IV SOLN
INTRAVENOUS | Status: AC
  Filled 2023-12-28: qty 2

## 2023-12-28 MED ORDER — NITROGLYCERIN 0.4 MG SL SUBL
0.4000 mg | SUBLINGUAL_TABLET | SUBLINGUAL | Status: DC | PRN
  Administered 2023-12-28: 0.4 mg via SUBLINGUAL
  Filled 2023-12-28: qty 1

## 2023-12-28 MED ORDER — HEPARIN SODIUM (PORCINE) 1000 UNIT/ML IJ SOLN
INTRAMUSCULAR | Status: DC | PRN
  Administered 2023-12-28: 4500 [IU] via INTRAVENOUS

## 2023-12-28 MED ORDER — SODIUM CHLORIDE 0.9 % WEIGHT BASED INFUSION: 1.0000 mL/kg/h | INTRAVENOUS | Status: DC

## 2023-12-28 MED ORDER — IOHEXOL 300 MG/ML  SOLN
INTRAMUSCULAR | Status: DC | PRN
  Administered 2023-12-28: 89 mL

## 2023-12-28 MED ORDER — LIDOCAINE HCL 1 % IJ SOLN
INTRAMUSCULAR | Status: AC
  Filled 2023-12-28: qty 20

## 2023-12-28 MED ORDER — VERAPAMIL HCL 2.5 MG/ML IV SOLN
INTRAVENOUS | Status: DC | PRN
  Administered 2023-12-28 (×2): 2.5 mg via INTRA_ARTERIAL

## 2023-12-28 SURGICAL SUPPLY — 9 items
CATH 5FR JL3.5 JR4 ANG PIG MP (CATHETERS) IMPLANT
CATH LAUNCHER 5F EBU3.0 (CATHETERS) IMPLANT
DEVICE RAD TR BAND REGULAR (VASCULAR PRODUCTS) IMPLANT
DRAPE BRACHIAL (DRAPES) IMPLANT
GLIDESHEATH SLEND SS 6F .021 (SHEATH) IMPLANT
GUIDEWIRE INQWIRE 1.5J.035X260 (WIRE) IMPLANT
PACK CARDIAC CATH (CUSTOM PROCEDURE TRAY) ×1 IMPLANT
SET ATX-X65L (MISCELLANEOUS) IMPLANT
STATION PROTECTION PRESSURIZED (MISCELLANEOUS) IMPLANT

## 2023-12-28 NOTE — OR Nursing (Signed)
 G. DeCoste NP assessed pt and ok to discharge

## 2023-12-28 NOTE — OR Nursing (Signed)
 Pt returned from cath lab with 8/10 chest pain. Dr Parks Bollman aware. Hypertension, labatelol given IV, bp reduced and chest pain "eased a bit". Patient ambulated to bathroom and pain returned to 8/10 with shortness of breath and pt anxious tearful. Dr Parks Bollman notified Kurt Phi PA assessed patient. 12 lead EKG and sl ntg with no decrease in pain. Second dose of labetalol  given and she reported pain decreased a bit.

## 2023-12-28 NOTE — OR Nursing (Signed)
 Pt reporting headache "bad" despite getting tylenol  30 minutes ago, reporting seeing flashing lights, chest pressure 3/10 which is level that she had at home prior to arrival.

## 2023-12-29 ENCOUNTER — Encounter: Payer: Self-pay | Admitting: Cardiology

## 2023-12-29 ENCOUNTER — Other Ambulatory Visit: Payer: Self-pay | Admitting: Internal Medicine

## 2023-12-29 ENCOUNTER — Ambulatory Visit
Admission: RE | Admit: 2023-12-29 | Discharge: 2023-12-29 | Disposition: A | Discharge status: Home / Self Care | Source: Ambulatory Visit | Attending: Internal Medicine | Admitting: Internal Medicine

## 2023-12-29 DIAGNOSIS — R1084 Generalized abdominal pain: Secondary | ICD-10-CM

## 2024-01-03 ENCOUNTER — Ambulatory Visit

## 2024-03-29 ENCOUNTER — Other Ambulatory Visit: Payer: Self-pay | Admitting: Advanced Practice Midwife

## 2024-03-29 DIAGNOSIS — N951 Menopausal and female climacteric states: Secondary | ICD-10-CM
